# Patient Record
Sex: Female | Born: 1980 | Hispanic: Yes | Marital: Single | Smoking: Never smoker
Health system: Southern US, Community
[De-identification: ages and names within clinical notes are randomized; demographics above are authoritative.]

## PROBLEM LIST (undated history)

## (undated) DIAGNOSIS — M5430 Sciatica, unspecified side: Secondary | ICD-10-CM

---

## 2007-06-24 ENCOUNTER — Inpatient Hospital Stay: Payer: Self-pay | Admitting: Obstetrics and Gynecology

## 2011-09-18 ENCOUNTER — Ambulatory Visit: Payer: Self-pay

## 2011-09-30 ENCOUNTER — Ambulatory Visit: Payer: Self-pay

## 2016-03-15 ENCOUNTER — Encounter: Payer: Self-pay | Admitting: Emergency Medicine

## 2016-03-15 ENCOUNTER — Emergency Department: Payer: BLUE CROSS/BLUE SHIELD

## 2016-03-15 ENCOUNTER — Emergency Department
Admission: EM | Admit: 2016-03-15 | Discharge: 2016-03-15 | Disposition: A | Discharge status: Home / Self Care | Payer: BLUE CROSS/BLUE SHIELD | Attending: Emergency Medicine | Admitting: Emergency Medicine

## 2016-03-15 DIAGNOSIS — O209 Hemorrhage in early pregnancy, unspecified: Secondary | ICD-10-CM | POA: Diagnosis present

## 2016-03-15 DIAGNOSIS — O200 Threatened abortion: Secondary | ICD-10-CM | POA: Diagnosis not present

## 2016-03-15 DIAGNOSIS — Z3A11 11 weeks gestation of pregnancy: Secondary | ICD-10-CM | POA: Insufficient documentation

## 2016-03-15 LAB — ABO/RH: ABO/RH(D): O POS

## 2016-03-15 LAB — URINALYSIS COMPLETE WITH MICROSCOPIC (ARMC ONLY)
BILIRUBIN URINE: NEGATIVE
Bacteria, UA: NONE SEEN
Glucose, UA: NEGATIVE mg/dL
HGB URINE DIPSTICK: NEGATIVE
KETONES UR: NEGATIVE mg/dL
LEUKOCYTES UA: NEGATIVE
Nitrite: NEGATIVE
PH: 7 (ref 5.0–8.0)
Protein, ur: NEGATIVE mg/dL
Specific Gravity, Urine: 1.017 (ref 1.005–1.030)

## 2016-03-15 LAB — CBC
HCT: 38.3 % (ref 35.0–47.0)
HEMOGLOBIN: 12.8 g/dL (ref 12.0–16.0)
MCH: 28.7 pg (ref 26.0–34.0)
MCHC: 33.5 g/dL (ref 32.0–36.0)
MCV: 85.6 fL (ref 80.0–100.0)
PLATELETS: 213 10*3/uL (ref 150–440)
RBC: 4.47 MIL/uL (ref 3.80–5.20)
RDW: 13.7 % (ref 11.5–14.5)
WBC: 9.8 10*3/uL (ref 3.6–11.0)

## 2016-03-15 LAB — HCG, QUANTITATIVE, PREGNANCY: HCG, BETA CHAIN, QUANT, S: 124942 m[IU]/mL — AB (ref <5)

## 2016-03-15 NOTE — ED Provider Notes (Signed)
[facility] Emergency Department Provider Note  Time seen: 4:19 PM  I have reviewed the triage vital signs and the nursing notes.   HISTORY  Chief Complaint Vaginal Bleeding    HPI Nicole Mayo is a 35 y.o. female with no past medical history G3 P2 who is approximately 10-[redacted] weeks pregnant presents the emergency department vaginal spotting. According to the patient she awoke this morning with some mild lower back pain. She states around lunchtime today she urinated and when she wiped she saw a mild amount of blood on the tissue paper. She has checked several times since and continues to have a mild amount of blood on tissue paper. Denies any clots or tissue passage. Denies any abdominal pain or cramping. Denies any history of miscarriage or abortion.     No past medical history on file.  There are no active problems to display for this patient.   No past surgical history on file.  No current outpatient prescriptions on file.  Allergies Review of patient's allergies indicates no known allergies.  No family history on file.  Social History Social History  Substance Use Topics  . Smoking status: Not on file  . Smokeless tobacco: Not on file  . Alcohol Use: Not on file    Review of Systems Constitutional: Negative for fever. Cardiovascular: Negative for chest pain. Respiratory: Negative for shortness of breath. Gastrointestinal: Negative for abdominal pain Genitourinary: Negative for dysuria. Positive for vaginal spotting. Musculoskeletal: Mild lower back pain, dull. Neurological: Negative for headache 10-point ROS otherwise negative.  ____________________________________________   PHYSICAL EXAM:  VITAL SIGNS: ED Triage Vitals  Enc Vitals Group     BP 03/15/16 1443 91/73 mmHg     Pulse Rate 03/15/16 1443 99     Resp 03/15/16 1443 16     Temp 03/15/16 1443 98.4 F (36.9 C)     Temp Source 03/15/16 1443 Oral     SpO2 03/15/16 1443 98  %     Weight 03/15/16 1443 155 lb (70.308 kg)     Height 03/15/16 1443 5\' 5"  (1.651 m)     Head Cir --      Peak Flow --      Pain Score 03/15/16 1444 3     Pain Loc --      Pain Edu? --      Excl. in GC? --     Constitutional: Alert and oriented. Well appearing and in no distress. Eyes: Normal exam ENT   Head: Normocephalic and atraumatic   Mouth/Throat: Mucous membranes are moist. Cardiovascular: Normal rate, regular rhythm. No murmur Respiratory: Normal respiratory effort without tachypnea nor retractions. Breath sounds are clear  Gastrointestinal: Soft and nontender. No distention.   Musculoskeletal: Nontender with normal range of motion in all extremities. Neurologic:  Normal speech and language. No gross focal neurologic deficits Skin:  Skin is warm, dry and intact.  Psychiatric: Mood and affect are normal.   ____________________________________________   RADIOLOGY  Ultrasound shows a single IUP 11 weeks 4 days.  ____________________________________________    INITIAL IMPRESSION / ASSESSMENT AND PLAN / ED COURSE  Pertinent labs & imaging results that were available during my care of the patient were reviewed by me and considered in my medical decision making (see chart for details).  The patient presents the emergency department with vaginal spotting since noon today. She does state some mild lower dull back pain. Denies any abdominal pain denies any cramping. Denies any vaginal discharge. Denies any hematuria  or dysuria. Patient's labs show an elevated beta hCG, urinalysis is negative, CBC is normal. We will proceed with an ultrasound to further evaluate. Patient is agreeable to plan. Overall the patient appears very well, no distress.  Ultrasound is within normal limits showing a single IUP at 11 weeks 4 days. Labs within normal limits. Patient is O+, no RhoGAM required. We'll discharge the patient home with OB/GYN  follow-up.  ____________________________________________   FINAL CLINICAL IMPRESSION(S) / ED DIAGNOSES  Threatened miscarriage   Minna AntisKevin Haakon Titsworth, MD 03/15/16 [phone]

## 2016-03-15 NOTE — ED Notes (Signed)
INterpreter at bedside for d/c teaching and update on test results. Pt verbalized understanding .

## 2016-03-15 NOTE — ED Notes (Signed)
Pt states she is approx 10-12 weeks preg, states that this am she woke up with some light vaginal bleeding and lower back pain

## 2016-03-15 NOTE — Discharge Instructions (Signed)
Amenaza de aborto  (Threatened Miscarriage)  La amenaza de aborto se produce cuando hay hemorragia vaginal durante las primeras 20 semanas de embarazo, pero el embarazo no se interrumpe. El médico le hará pruebas para asegurarse de que el embarazo continúe. La causa de la hemorragia puede ser desconocida. Este trastorno no significa que el embarazo terminará. Sin embargo, aumenta el riesgo de que el embarazo se interrumpa (aborto completo).  CUIDADOS EN EL HOGAR   · Asegúrese de asistir a todas las citas de cuidados prenatales con el médico.  · Descanse lo suficiente.  · No tenga relaciones sexuales ni use tampones si tiene hemorragia vaginal.  · No se haga duchas vaginales.  · No fume ni consuma drogas.  · No beba alcohol.  · Evite la cafeína.  SOLICITE AYUDA SI:  · Tiene una hemorragia leve de la vagina.  · Tiene dolor o cólicos abdominales.  · Tiene fiebre.  SOLICITE AYUDA DE INMEDIATO SI:   · Tiene hemorragia abundante de la vagina.  · Elimina coágulos de sangre por la vagina.  · Tiene mucho dolor en el abdomen o la parte baja de la espalda, cólicos abdominales o calambres en la parte baja de la espalda.  · Tiene fiebre, escalofríos y mucho dolor abdominal.  ASEGÚRESE DE QUE:   · Comprende estas instrucciones.  · Controlará su afección.  · Recibirá ayuda de inmediato si no mejora o si empeora.     Esta información no tiene como fin reemplazar el consejo del médico. Asegúrese de hacerle al médico cualquier pregunta que tenga.     Document Released: 10/05/2010 Document Revised: 09/07/2013  Elsevier Interactive Patient Education ©2016 [employer]vier Inc[employer]

## 2016-03-22 LAB — OB RESULTS CONSOLE HIV ANTIBODY (ROUTINE TESTING): HIV: NONREACTIVE

## 2016-03-22 LAB — OB RESULTS CONSOLE VARICELLA ZOSTER ANTIBODY, IGG: VARICELLA IGG: IMMUNE

## 2016-03-22 LAB — OB RESULTS CONSOLE GC/CHLAMYDIA
CHLAMYDIA, DNA PROBE: NEGATIVE
Gonorrhea: NEGATIVE

## 2016-03-22 LAB — OB RESULTS CONSOLE HEPATITIS B SURFACE ANTIGEN: Hepatitis B Surface Ag: NEGATIVE

## 2016-03-22 LAB — OB RESULTS CONSOLE RUBELLA ANTIBODY, IGM
RUBELLA: IMMUNE
Rubella: NON-IMMUNE/NOT IMMUNE

## 2016-03-22 LAB — OB RESULTS CONSOLE RPR: RPR: NONREACTIVE

## 2016-04-19 ENCOUNTER — Other Ambulatory Visit: Payer: Self-pay | Admitting: Primary Care

## 2016-04-19 DIAGNOSIS — Z3490 Encounter for supervision of normal pregnancy, unspecified, unspecified trimester: Secondary | ICD-10-CM

## 2016-04-26 ENCOUNTER — Ambulatory Visit
Admission: RE | Admit: 2016-04-26 | Discharge: 2016-04-26 | Disposition: A | Discharge status: Home / Self Care | Payer: BLUE CROSS/BLUE SHIELD | Source: Ambulatory Visit | Attending: Primary Care | Admitting: Primary Care

## 2016-04-26 DIAGNOSIS — Z331 Pregnant state, incidental: Secondary | ICD-10-CM | POA: Insufficient documentation

## 2016-04-26 DIAGNOSIS — Z3A17 17 weeks gestation of pregnancy: Secondary | ICD-10-CM | POA: Insufficient documentation

## 2016-04-26 DIAGNOSIS — O321XX0 Maternal care for breech presentation, not applicable or unspecified: Secondary | ICD-10-CM | POA: Diagnosis not present

## 2016-04-26 DIAGNOSIS — Z3490 Encounter for supervision of normal pregnancy, unspecified, unspecified trimester: Secondary | ICD-10-CM

## 2016-05-07 ENCOUNTER — Other Ambulatory Visit: Payer: Self-pay | Admitting: Primary Care

## 2016-05-07 DIAGNOSIS — Z0489 Encounter for examination and observation for other specified reasons: Secondary | ICD-10-CM

## 2016-05-07 DIAGNOSIS — IMO0002 Reserved for concepts with insufficient information to code with codable children: Secondary | ICD-10-CM

## 2016-05-10 ENCOUNTER — Other Ambulatory Visit: Payer: Self-pay | Admitting: Primary Care

## 2016-05-10 DIAGNOSIS — Z0489 Encounter for examination and observation for other specified reasons: Secondary | ICD-10-CM

## 2016-05-10 DIAGNOSIS — IMO0002 Reserved for concepts with insufficient information to code with codable children: Secondary | ICD-10-CM

## 2016-05-16 ENCOUNTER — Ambulatory Visit: Admission: RE | Admit: 2016-05-16 | Payer: BLUE CROSS/BLUE SHIELD | Source: Ambulatory Visit

## 2016-05-16 ENCOUNTER — Ambulatory Visit
Admission: RE | Admit: 2016-05-16 | Discharge: 2016-05-16 | Disposition: A | Discharge status: Home / Self Care | Payer: BLUE CROSS/BLUE SHIELD | Source: Ambulatory Visit | Attending: Primary Care | Admitting: Primary Care

## 2016-05-16 DIAGNOSIS — Z331 Pregnant state, incidental: Secondary | ICD-10-CM | POA: Insufficient documentation

## 2016-05-16 DIAGNOSIS — IMO0002 Reserved for concepts with insufficient information to code with codable children: Secondary | ICD-10-CM

## 2016-05-16 DIAGNOSIS — Z3A20 20 weeks gestation of pregnancy: Secondary | ICD-10-CM | POA: Diagnosis not present

## 2016-05-16 DIAGNOSIS — Z0489 Encounter for examination and observation for other specified reasons: Secondary | ICD-10-CM

## 2016-08-30 LAB — OB RESULTS CONSOLE GBS: GBS: POSITIVE

## 2016-09-24 ENCOUNTER — Observation Stay
Admission: EM | Admit: 2016-09-24 | Discharge: 2016-09-24 | Disposition: A | Discharge status: Home / Self Care | Payer: BLUE CROSS/BLUE SHIELD | Attending: Obstetrics and Gynecology | Admitting: Obstetrics and Gynecology

## 2016-09-24 DIAGNOSIS — O368190 Decreased fetal movements, unspecified trimester, not applicable or unspecified: Secondary | ICD-10-CM | POA: Diagnosis not present

## 2016-09-24 DIAGNOSIS — Z3A00 Weeks of gestation of pregnancy not specified: Secondary | ICD-10-CM | POA: Diagnosis not present

## 2016-09-24 HISTORY — DX: Sciatica, unspecified side: M54.30

## 2016-09-24 MED ORDER — ACETAMINOPHEN 325 MG PO TABS: 650.0000 mg | ORAL_TABLET | ORAL | Status: DC | PRN

## 2016-09-24 MED ORDER — OXYCODONE-ACETAMINOPHEN 5-325 MG PO TABS: 1.0000 | ORAL_TABLET | ORAL | Status: DC | PRN

## 2016-09-24 MED ORDER — LACTATED RINGERS IV SOLN: 500.0000 mL | INTRAVENOUS | Status: DC | PRN

## 2016-09-24 NOTE — OB Triage Note (Signed)
Pt presents c/o No fetal movement since last night around 7:00 pm. Denies any pain, bleeding, LOF, or NVD.

## 2016-09-24 NOTE — H&P (Signed)
Nicole Mayo is a 36 y.o. female presenting for "decreased fetal movement today". [facility]  significant for LMP of  12/19/15 & EDD of 09/24/16 and c/w EDD of 09/30/16 per US at 11 4/7 weeks.  OB History    Gravida Para Term Preterm AB Living   3 2       2    SAB TAB Ectopic Multiple Live Births                 Past Medical History:  Diagnosis Date  . Sciatica   ASCUS pap hx History reviewed. No pertinent surgical history. Family History:Dad: HTN, Aunt: DM Social History:  reports that she has never smoked. She has never used smokeless tobacco. She reports that she does not drink alcohol or use drugs.     Maternal Diabetes: 102 Genetic Screening: no 1st trimester screen, ?Declined AFP Maternal Ultrasounds/Referrals: US: WNL Fetal Ultrasounds or other Referrals:  None Maternal Substance Abuse:  None Significant Maternal Medications:  PNV Significant Maternal Lab Results:  Per below Other Comments:   Review of Systems  Constitutional: Negative.   HENT: Negative.   Eyes: Negative.   Respiratory: Negative.   Cardiovascular: Negative.   Gastrointestinal: Negative.   Genitourinary: Negative.   Musculoskeletal: Negative.   Skin: Negative.   Neurological: Negative.   Endo/Heme/Allergies: Negative.   Psychiatric/Behavioral: Negative.    History   Blood pressure 112/87, pulse (!) 120, temperature 98.3 F (36.8 C), temperature source Oral, resp. rate 20, height 5\' 5"  (1.651 m), weight 86.6 kg (191 lb), last menstrual period 12/23/2015. Exam Physical Exam  Gen: 36 yo Hispanic female in NAD. HEENT: Eyes non-icteric. Normocephalic. Resp: reg and non-labored. ABD: gravid Vaginal: VE done with CX:1/50%/very post cx vtx-3 Vitals:   09/24/16 1743  BP: 112/87  Pulse: (!) 120  Resp: 20  Temp: 98.3 F (36.8 C)   Prenatal labs: ABO, Rh: --/--/O POS (06/30 1449) Antibody:  Neg Rubella:  Non-immune RPR:   RPR NR, GC/CH neg,  HBsAg:   Neg HIV:   NR GBS:    1 h GCt  102 Assessment/Plan: A: IUP at  2. Decreased FM P: NST Reactive with 2 accels 15 x 15 BPM 2. No Labor S/S 3. Dc home with daily FKC's. 4. [facility]  Nicole Mayo 09/24/2016, 6:13 PM

## 2016-09-24 NOTE — Discharge Instructions (Signed)
Evaluación de los movimientos fetales   (Fetal Movement Counts)  Nombre del paciente: __________________________________________________ Fecha de parto estimada: ____________________  La evaluación de los movimientos fetales es muy recomendable en los embarazos de alto riesgo, pero también es una buena idea que lo hagan todas las embarazadas. El médico le indicará que comience a contarlos a las 28 semanas de embarazo. Los movimientos fetales suelen aumentar:   · Después de una comida completa.  · Después de la actividad física.  · Después de comer o beber algo dulce o frío.  · En reposo.  Preste atención cuando sienta que el bebé está más activo. Esto le ayudará a notar un patrón de ciclos de vigilia y sueño de su bebé y cuáles son los factores que contribuyen a un aumento de los movimientos fetales. Es importante llevar a cabo un recuento de movimientos fetales, al mismo tiempo cada día, cuando el bebé normalmente está más activo.   CÓMO CONTAR LOS MOVIMIENTOS FETALES  1. Busque un lugar tranquilo y cómodo para sentarse o recostarse sobre el lado izquierdo. Al recostarse sobre su lado izquierdo, le proporciona una mejor circulación de sangre y oxígeno al bebé.  2. Anote el día y la hora en una hoja de papel o en un diario.  3. Comience contando las pataditas, revoloteos, chasquidos, vueltas o pinchazos en un período de 2 horas. Debe sentir al menos 10 movimientos en 2 horas.  4. Si no siente 10 movimientos en 2 horas, espere 2 ó 3 horas y cuente de nuevo. Busque cambios en el patrón o si no cuenta lo suficiente en 2 horas.  SOLICITE ATENCIÓN MÉDICA SI:   · Siente menos de 10 pataditas en 2 horas, en dos intentos.  · No hay movimientos durante una hora.  · El patrón se modifica o le lleva más tiempo cada día contar las 10 pataditas.  · Siente que el bebé no se mueve como lo hace habitualmente.  Fecha: ____________ Movimientos: ____________ Hora de inicio: ____________ Hora de finalización: ____________   Fecha:  ____________ Movimientos: ____________ Hora de inicio: ____________ Hora de finalización: ____________   Fecha: ____________ Movimientos: ____________ Hora de inicio: ____________ Hora de finalización: ____________   Fecha: ____________ Movimientos: ____________ Hora de inicio: ____________ Hora de finalización: ____________   Fecha: ____________ Movimientos: ____________ Hora de inicio: ____________ Hora de finalización: ____________   Fecha: ____________ Movimientos: ____________ Hora de inicio: ____________ Hora de finalización: ____________   Fecha: ____________ Movimientos: ____________ Hora de inicio: ____________ Hora de finalización: ____________   Fecha: ____________ Movimientos: ____________ Hora de inicio: ____________ Hora de finalización: ____________   Fecha: ____________ Movimientos: ____________ Hora de inicio: ____________ Hora de finalización: ____________   Fecha: ____________ Movimientos: ____________ Hora de inicio: ____________ Hora de finalización: ____________   Fecha: ____________ Movimientos: ____________ Hora de inicio: ____________ Hora de finalización: ____________   Fecha: ____________ Movimientos: ____________ Hora de inicio: ____________ Hora de finalización: ____________   Fecha: ____________ Movimientos: ____________ Hora de inicio: ____________ Hora de finalización: ____________   Fecha: ____________ Movimientos: ____________ Hora de inicio: ____________ Hora de finalización: ____________   Fecha: ____________ Movimientos: ____________ Hora de inicio: ____________ Hora de finalización: ____________   Fecha: ____________ Movimientos: ____________ Hora de inicio: ____________ Hora de finalización: ____________   Fecha: ____________ Movimientos: ____________ Hora de inicio: ____________ Hora de finalización: ____________   Fecha: ____________ Movimientos: ____________ Hora de inicio: ____________ Hora de finalización: ____________   Fecha: ____________ Movimientos: ____________ Hora  de inicio: ____________ Hora de finalización: ____________     Fecha: ____________ Movimientos: ____________ Hora de inicio: ____________ Hora de finalización: ____________   Fecha: ____________ Movimientos: ____________ Hora de inicio: ____________ Hora de finalización: ____________   Fecha: ____________ Movimientos: ____________ Hora de inicio: ____________ Hora de finalización: ____________   Fecha: ____________ Movimientos: ____________ Hora de inicio: ____________ Hora de finalización: ____________   Fecha: ____________ Movimientos: ____________ Hora de inicio: ____________ Hora de finalización: ____________   Fecha: ____________ Movimientos: ____________ Hora de inicio: ____________ Hora de finalización: ____________   Fecha: ____________ Movimientos: ____________ Hora de inicio: ____________ Hora de finalización: ____________   Fecha: ____________ Movimientos: ____________ Hora de inicio: ____________ Hora de finalización: ____________   Fecha: ____________ Movimientos: ____________ Hora de inicio: ____________ Hora de finalización: ____________   Fecha: ____________ Movimientos: ____________ Hora de inicio: ____________ Hora de finalización: ____________   Fecha: ____________ Movimientos: ____________ Hora de inicio: ____________ Hora de finalización: ____________   Fecha: ____________ Movimientos: ____________ Hora de inicio: ____________ Hora de finalización: ____________   Fecha: ____________ Movimientos: ____________ Hora de inicio: ____________ Hora de finalización: ____________   Fecha: ____________ Movimientos: ____________ Hora de inicio: ____________ Hora de finalización: ____________   Fecha: ____________ Movimientos: ____________ Hora de inicio: ____________ Hora de finalización: ____________   Fecha: ____________ Movimientos: ____________ Hora de inicio: ____________ Hora de finalización: ____________   Fecha: ____________ Movimientos: ____________ Hora de inicio: ____________ Hora de finalización:  ____________   Fecha: ____________ Movimientos: ____________ Hora de inicio: ____________ Hora de finalización: ____________   Fecha: ____________ Movimientos: ____________ Hora de inicio: ____________ Hora de finalización: ____________   Fecha: ____________ Movimientos: ____________ Hora de inicio: ____________ Hora de finalización: ____________   Fecha: ____________ Movimientos: ____________ Hora de inicio: ____________ Hora de finalización: ____________   Fecha: ____________ Movimientos: ____________ Hora de inicio: ____________ Hora de finalización: ____________   Fecha: ____________ Movimientos: ____________ Hora de inicio: ____________ Hora de finalización: ____________   Fecha: ____________ Movimientos: ____________ Hora de inicio: ____________ Hora de finalización: ____________   Fecha: ____________ Movimientos: ____________ Hora de inicio: ____________ Hora de finalización: ____________   Fecha: ____________ Movimientos: ____________ Hora de inicio: ____________ Hora de finalización: ____________   Fecha: ____________ Movimientos: ____________ Hora de inicio: ____________ Hora de finalización: ____________   Fecha: ____________ Movimientos: ____________ Hora de inicio: ____________ Hora de finalización: ____________   Fecha: ____________ Movimientos: ____________ Hora de inicio: ____________ Hora de finalización: ____________   Fecha: ____________ Movimientos: ____________ Hora de inicio: ____________ Hora de finalización: ____________   Fecha: ____________ Movimientos: ____________ Hora de inicio: ____________ Hora de finalización: ____________   Fecha: ____________ Movimientos: ____________ Hora de inicio: ____________ Hora de finalización: ____________   Fecha: ____________ Movimientos: ____________ Hora de inicio: ____________ Hora de finalización: ____________   Fecha: ____________ Movimientos: ____________ Hora de inicio: ____________ Hora de finalización: ____________   Fecha: ____________  Movimientos: ____________ Hora de inicio: ____________ Hora de finalización: ____________   Fecha: ____________ Movimientos: ____________ Hora de inicio: ____________ Hora de finalización: ____________   Fecha: ____________ Movimientos: ____________ Hora de inicio: ____________ Hora de finalización: ____________   Esta información no tiene como fin reemplazar el consejo del médico. Asegúrese de hacerle al médico cualquier pregunta que tenga.  Document Released: 12/10/2007 Document Revised: 08/19/2012  Elsevier Interactive Patient Education © 2017 Elsevier Inc.

## 2016-10-06 ENCOUNTER — Inpatient Hospital Stay
Admission: RE | Admit: 2016-10-06 | Discharge: 2016-10-09 | DRG: 765 | Disposition: A | Discharge status: Home / Self Care | Payer: BLUE CROSS/BLUE SHIELD | Attending: Obstetrics and Gynecology | Admitting: Obstetrics and Gynecology

## 2016-10-06 DIAGNOSIS — O9081 Anemia of the puerperium: Secondary | ICD-10-CM | POA: Diagnosis not present

## 2016-10-06 DIAGNOSIS — O329XX0 Maternal care for malpresentation of fetus, unspecified, not applicable or unspecified: Secondary | ICD-10-CM

## 2016-10-06 DIAGNOSIS — E669 Obesity, unspecified: Secondary | ICD-10-CM | POA: Diagnosis present

## 2016-10-06 DIAGNOSIS — B951 Streptococcus, group B, as the cause of diseases classified elsewhere: Secondary | ICD-10-CM | POA: Diagnosis present

## 2016-10-06 DIAGNOSIS — O480 Post-term pregnancy: Principal | ICD-10-CM | POA: Diagnosis present

## 2016-10-06 DIAGNOSIS — D696 Thrombocytopenia, unspecified: Secondary | ICD-10-CM | POA: Diagnosis not present

## 2016-10-06 DIAGNOSIS — D62 Acute posthemorrhagic anemia: Secondary | ICD-10-CM | POA: Diagnosis not present

## 2016-10-06 DIAGNOSIS — O4103X0 Oligohydramnios, third trimester, not applicable or unspecified: Secondary | ICD-10-CM | POA: Diagnosis present

## 2016-10-06 DIAGNOSIS — O99824 Streptococcus B carrier state complicating childbirth: Secondary | ICD-10-CM | POA: Diagnosis present

## 2016-10-06 DIAGNOSIS — Z6831 Body mass index (BMI) 31.0-31.9, adult: Secondary | ICD-10-CM

## 2016-10-06 DIAGNOSIS — O321XX0 Maternal care for breech presentation, not applicable or unspecified: Secondary | ICD-10-CM | POA: Diagnosis present

## 2016-10-06 DIAGNOSIS — O99214 Obesity complicating childbirth: Secondary | ICD-10-CM | POA: Diagnosis present

## 2016-10-06 DIAGNOSIS — Z3A40 40 weeks gestation of pregnancy: Secondary | ICD-10-CM | POA: Diagnosis not present

## 2016-10-06 MED ORDER — ONDANSETRON HCL 4 MG/2ML IJ SOLN: 4.0000 mg | Freq: Four times a day (QID) | INTRAMUSCULAR | Status: DC | PRN

## 2016-10-06 MED ORDER — LACTATED RINGERS IV SOLN
INTRAVENOUS | Status: DC
  Administered 2016-10-07 (×2): via INTRAVENOUS

## 2016-10-06 MED ORDER — DINOPROSTONE 10 MG VA INST
10.0000 mg | VAGINAL_INSERT | Freq: Once | VAGINAL | Status: DC
  Filled 2016-10-06: qty 1

## 2016-10-06 MED ORDER — OXYTOCIN BOLUS FROM INFUSION: 500.0000 mL | Freq: Once | INTRAVENOUS | Status: DC

## 2016-10-06 MED ORDER — LIDOCAINE HCL (PF) 1 % IJ SOLN: 30.0000 mL | INTRAMUSCULAR | Status: DC | PRN

## 2016-10-06 MED ORDER — TERBUTALINE SULFATE 1 MG/ML IJ SOLN: 0.2500 mg | Freq: Once | INTRAMUSCULAR | Status: DC | PRN

## 2016-10-06 MED ORDER — SOD CITRATE-CITRIC ACID 500-334 MG/5ML PO SOLN: 30.0000 mL | ORAL | Status: DC | PRN

## 2016-10-06 MED ORDER — PENICILLIN G POTASSIUM 5000000 UNITS IJ SOLR
5.0000 10*6.[IU] | Freq: Once | INTRAVENOUS | Status: AC
  Administered 2016-10-07: 5 10*6.[IU] via INTRAVENOUS
  Filled 2016-10-06: qty 5

## 2016-10-06 MED ORDER — ACETAMINOPHEN 325 MG PO TABS: 650.0000 mg | ORAL_TABLET | ORAL | Status: DC | PRN

## 2016-10-06 MED ORDER — OXYTOCIN 40 UNITS IN LACTATED RINGERS INFUSION - SIMPLE MED: 2.5000 [IU]/h | INTRAVENOUS | Status: DC

## 2016-10-06 MED ORDER — LACTATED RINGERS IV SOLN
500.0000 mL | INTRAVENOUS | Status: DC | PRN
  Administered 2016-10-07: 1000 mL via INTRAVENOUS

## 2016-10-06 MED ORDER — PENICILLIN G POT IN DEXTROSE 60000 UNIT/ML IV SOLN
3.0000 10*6.[IU] | INTRAVENOUS | Status: DC
  Administered 2016-10-07 (×2): 3 10*6.[IU] via INTRAVENOUS
  Filled 2016-10-06 (×7): qty 50

## 2016-10-06 MED ORDER — BUTORPHANOL TARTRATE 1 MG/ML IJ SOLN: 1.0000 mg | INTRAMUSCULAR | Status: DC | PRN

## 2016-10-07 ENCOUNTER — Inpatient Hospital Stay: Payer: BLUE CROSS/BLUE SHIELD

## 2016-10-07 ENCOUNTER — Encounter: Payer: Self-pay | Admitting: Obstetrics and Gynecology

## 2016-10-07 ENCOUNTER — Inpatient Hospital Stay: Payer: BLUE CROSS/BLUE SHIELD | Admitting: Anesthesiology

## 2016-10-07 ENCOUNTER — Encounter
Admission: RE | Disposition: A | Discharge status: Home / Self Care | Payer: Self-pay | Source: Home / Self Care | Attending: Obstetrics and Gynecology

## 2016-10-07 LAB — CBC
HCT: 38.7 % (ref 35.0–47.0)
HEMOGLOBIN: 13.2 g/dL (ref 12.0–16.0)
MCH: 28.6 pg (ref 26.0–34.0)
MCHC: 34.1 g/dL (ref 32.0–36.0)
MCV: 84 fL (ref 80.0–100.0)
Platelets: 161 10*3/uL (ref 150–440)
RBC: 4.6 MIL/uL (ref 3.80–5.20)
RDW: 15 % — ABNORMAL HIGH (ref 11.5–14.5)
WBC: 10.4 10*3/uL (ref 3.6–11.0)

## 2016-10-07 LAB — COMPREHENSIVE METABOLIC PANEL
ALBUMIN: 3 g/dL — AB (ref 3.5–5.0)
ALT: 15 U/L (ref 14–54)
AST: 26 U/L (ref 15–41)
Alkaline Phosphatase: 176 U/L — ABNORMAL HIGH (ref 38–126)
Anion gap: 7 (ref 5–15)
BUN: 17 mg/dL (ref 6–20)
CHLORIDE: 107 mmol/L (ref 101–111)
CO2: 23 mmol/L (ref 22–32)
Calcium: 8.9 mg/dL (ref 8.9–10.3)
Creatinine, Ser: 0.71 mg/dL (ref 0.44–1.00)
GFR calc Af Amer: >60 mL/min (ref >60)
GFR calc non Af Amer: >60 mL/min (ref >60)
GLUCOSE: 58 mg/dL — AB (ref 65–99)
POTASSIUM: 4 mmol/L (ref 3.5–5.1)
SODIUM: 137 mmol/L (ref 135–145)
Total Bilirubin: 0.2 mg/dL — ABNORMAL LOW (ref 0.3–1.2)
Total Protein: 6.7 g/dL (ref 6.5–8.1)

## 2016-10-07 LAB — PROTEIN / CREATININE RATIO, URINE
Creatinine, Urine: 41 mg/dL
Protein Creatinine Ratio: 0.22 mg/mg{Cre} — ABNORMAL HIGH (ref 0.00–0.15)
Total Protein, Urine: 9 mg/dL

## 2016-10-07 LAB — TYPE AND SCREEN
ABO/RH(D): O POS
Antibody Screen: NEGATIVE

## 2016-10-07 SURGERY — Surgical Case
Anesthesia: Spinal | Wound class: Clean Contaminated

## 2016-10-07 MED ORDER — NALOXONE HCL 2 MG/2ML IJ SOSY
1.0000 ug/kg/h | PREFILLED_SYRINGE | INTRAVENOUS | Status: DC | PRN
  Filled 2016-10-07: qty 2

## 2016-10-07 MED ORDER — PROPOFOL 10 MG/ML IV BOLUS
INTRAVENOUS | Status: AC
  Filled 2016-10-07: qty 20

## 2016-10-07 MED ORDER — NALBUPHINE HCL 10 MG/ML IJ SOLN: 5.0000 mg | INTRAMUSCULAR | Status: DC | PRN

## 2016-10-07 MED ORDER — BUPIVACAINE HCL (PF) 0.25 % IJ SOLN
INTRAMUSCULAR | Status: AC
  Filled 2016-10-07: qty 30

## 2016-10-07 MED ORDER — KETOROLAC TROMETHAMINE 30 MG/ML IJ SOLN
30.0000 mg | Freq: Four times a day (QID) | INTRAMUSCULAR | Status: AC
  Administered 2016-10-08 (×2): 30 mg via INTRAVENOUS
  Filled 2016-10-07 (×2): qty 1

## 2016-10-07 MED ORDER — OXYCODONE HCL 5 MG PO TABS
10.0000 mg | ORAL_TABLET | ORAL | Status: DC | PRN
  Filled 2016-10-07: qty 2

## 2016-10-07 MED ORDER — NALOXONE HCL 0.4 MG/ML IJ SOLN: 0.4000 mg | INTRAMUSCULAR | Status: DC | PRN

## 2016-10-07 MED ORDER — DIPHENHYDRAMINE HCL 25 MG PO CAPS
25.0000 mg | ORAL_CAPSULE | Freq: Four times a day (QID) | ORAL | Status: DC | PRN
  Administered 2016-10-07: 25 mg via ORAL
  Filled 2016-10-07: qty 1

## 2016-10-07 MED ORDER — CEFAZOLIN SODIUM-DEXTROSE 2-4 GM/100ML-% IV SOLN
2.0000 g | INTRAVENOUS | Status: AC
  Administered 2016-10-07: 2 g via INTRAVENOUS
  Filled 2016-10-07 (×2): qty 100

## 2016-10-07 MED ORDER — MORPHINE SULFATE (PF) 0.5 MG/ML IJ SOLN
INTRAMUSCULAR | Status: DC | PRN
  Administered 2016-10-07: .1 mg via INTRATHECAL

## 2016-10-07 MED ORDER — KETOROLAC TROMETHAMINE 30 MG/ML IJ SOLN: 30.0000 mg | Freq: Four times a day (QID) | INTRAMUSCULAR | Status: AC

## 2016-10-07 MED ORDER — BUPIVACAINE LIPOSOME 1.3 % IJ SUSP
INTRAMUSCULAR | Status: DC | PRN
  Administered 2016-10-07: 70 mL

## 2016-10-07 MED ORDER — OXYTOCIN 40 UNITS IN LACTATED RINGERS INFUSION - SIMPLE MED
2.5000 [IU]/h | INTRAVENOUS | Status: AC
  Filled 2016-10-07: qty 1000

## 2016-10-07 MED ORDER — SODIUM CHLORIDE 0.9 % IJ SOLN
INTRAMUSCULAR | Status: AC
  Filled 2016-10-07: qty 50

## 2016-10-07 MED ORDER — BUPIVACAINE HCL (PF) 0.25 % IJ SOLN
INTRAMUSCULAR | Status: DC | PRN
  Administered 2016-10-07: 30 mL

## 2016-10-07 MED ORDER — OXYCODONE-ACETAMINOPHEN 5-325 MG PO TABS
2.0000 | ORAL_TABLET | ORAL | Status: DC | PRN
  Filled 2016-10-07: qty 2

## 2016-10-07 MED ORDER — DIPHENHYDRAMINE HCL 25 MG PO CAPS
25.0000 mg | ORAL_CAPSULE | ORAL | Status: DC | PRN
  Administered 2016-10-08: 25 mg via ORAL
  Filled 2016-10-07: qty 1

## 2016-10-07 MED ORDER — OXYCODONE HCL 5 MG PO TABS
10.0000 mg | ORAL_TABLET | ORAL | Status: DC | PRN
Start: 2016-10-07 — End: 2016-10-07

## 2016-10-07 MED ORDER — SENNOSIDES-DOCUSATE SODIUM 8.6-50 MG PO TABS
2.0000 | ORAL_TABLET | ORAL | Status: DC
  Administered 2016-10-08 – 2016-10-09 (×2): 2 via ORAL
  Filled 2016-10-07 (×2): qty 2

## 2016-10-07 MED ORDER — NALBUPHINE HCL 10 MG/ML IJ SOLN: 5.0000 mg | Freq: Once | INTRAMUSCULAR | Status: DC | PRN

## 2016-10-07 MED ORDER — ONDANSETRON HCL 4 MG/2ML IJ SOLN: 4.0000 mg | Freq: Three times a day (TID) | INTRAMUSCULAR | Status: DC | PRN

## 2016-10-07 MED ORDER — FLEET ENEMA 7-19 GM/118ML RE ENEM: 1.0000 | ENEMA | Freq: Every day | RECTAL | Status: DC | PRN

## 2016-10-07 MED ORDER — OXYTOCIN 40 UNITS IN LACTATED RINGERS INFUSION - SIMPLE MED
INTRAVENOUS | Status: DC | PRN
  Administered 2016-10-07: 500 mL via INTRAVENOUS

## 2016-10-07 MED ORDER — SODIUM CHLORIDE 0.9% FLUSH: 3.0000 mL | INTRAVENOUS | Status: DC | PRN

## 2016-10-07 MED ORDER — DIPHENHYDRAMINE HCL 50 MG/ML IJ SOLN: 12.5000 mg | INTRAMUSCULAR | Status: DC | PRN

## 2016-10-07 MED ORDER — OXYCODONE HCL 5 MG PO TABS: 5.0000 mg | ORAL_TABLET | ORAL | Status: DC | PRN

## 2016-10-07 MED ORDER — BISACODYL 10 MG RE SUPP: 10.0000 mg | Freq: Every day | RECTAL | Status: DC | PRN

## 2016-10-07 MED ORDER — SIMETHICONE 80 MG PO CHEW
80.0000 mg | CHEWABLE_TABLET | ORAL | Status: DC
  Filled 2016-10-07: qty 1

## 2016-10-07 MED ORDER — HYDRALAZINE HCL 20 MG/ML IJ SOLN
10.0000 mg | Freq: Once | INTRAMUSCULAR | Status: DC | PRN
  Filled 2016-10-07: qty 1

## 2016-10-07 MED ORDER — SIMETHICONE 80 MG PO CHEW
80.0000 mg | CHEWABLE_TABLET | Freq: Three times a day (TID) | ORAL | Status: DC
  Administered 2016-10-08 – 2016-10-09 (×4): 80 mg via ORAL
  Filled 2016-10-07 (×4): qty 1

## 2016-10-07 MED ORDER — DIBUCAINE 1 % RE OINT: 1.0000 "application " | TOPICAL_OINTMENT | RECTAL | Status: DC | PRN

## 2016-10-07 MED ORDER — IBUPROFEN 600 MG PO TABS
600.0000 mg | ORAL_TABLET | Freq: Four times a day (QID) | ORAL | Status: DC
  Administered 2016-10-07 – 2016-10-09 (×5): 600 mg via ORAL
  Filled 2016-10-07 (×5): qty 1

## 2016-10-07 MED ORDER — PHENYLEPHRINE HCL 10 MG/ML IJ SOLN
INTRAMUSCULAR | Status: DC | PRN
Start: 2016-10-07 — End: 2016-10-07
  Administered 2016-10-07 (×5): 100 ug via INTRAVENOUS
  Administered 2016-10-07: 80 ug via INTRAVENOUS

## 2016-10-07 MED ORDER — SIMETHICONE 80 MG PO CHEW
80.0000 mg | CHEWABLE_TABLET | ORAL | Status: DC | PRN
  Administered 2016-10-08: 80 mg via ORAL

## 2016-10-07 MED ORDER — MENTHOL 3 MG MT LOZG
1.0000 | LOZENGE | OROMUCOSAL | Status: DC | PRN
  Filled 2016-10-07: qty 9

## 2016-10-07 MED ORDER — BUPIVACAINE IN DEXTROSE 0.75-8.25 % IT SOLN
INTRATHECAL | Status: DC | PRN
  Administered 2016-10-07: 1.6 mL via INTRATHECAL

## 2016-10-07 MED ORDER — PRENATAL MULTIVITAMIN CH
1.0000 | ORAL_TABLET | Freq: Every day | ORAL | Status: DC
  Administered 2016-10-08: 1 via ORAL
  Filled 2016-10-07: qty 1

## 2016-10-07 MED ORDER — LACTATED RINGERS IV SOLN
INTRAVENOUS | Status: DC
  Administered 2016-10-07: 12:00:00 via INTRAVENOUS

## 2016-10-07 MED ORDER — ACETAMINOPHEN 325 MG PO TABS
650.0000 mg | ORAL_TABLET | ORAL | Status: DC | PRN
  Administered 2016-10-08: 650 mg via ORAL

## 2016-10-07 MED ORDER — MORPHINE SULFATE (PF) 0.5 MG/ML IJ SOLN
INTRAMUSCULAR | Status: AC
  Filled 2016-10-07: qty 10

## 2016-10-07 MED ORDER — OXYTOCIN 40 UNITS IN LACTATED RINGERS INFUSION - SIMPLE MED
INTRAVENOUS | Status: AC
  Administered 2016-10-07: 15:00:00
  Filled 2016-10-07: qty 1000

## 2016-10-07 MED ORDER — COCONUT OIL OIL: 1.0000 "application " | TOPICAL_OIL | Status: DC | PRN

## 2016-10-07 MED ORDER — EPHEDRINE SULFATE 50 MG/ML IJ SOLN
INTRAMUSCULAR | Status: DC | PRN
  Administered 2016-10-07 (×3): 10 mg via INTRAVENOUS

## 2016-10-07 MED ORDER — LABETALOL HCL 5 MG/ML IV SOLN
20.0000 mg | INTRAVENOUS | Status: DC | PRN
  Filled 2016-10-07: qty 16

## 2016-10-07 MED ORDER — SCOPOLAMINE 1 MG/3DAYS TD PT72: 1.0000 | MEDICATED_PATCH | Freq: Once | TRANSDERMAL | Status: DC

## 2016-10-07 MED ORDER — ACETAMINOPHEN 325 MG PO TABS
650.0000 mg | ORAL_TABLET | Freq: Four times a day (QID) | ORAL | Status: AC
  Administered 2016-10-08 (×3): 650 mg via ORAL
  Filled 2016-10-07 (×4): qty 2

## 2016-10-07 MED ORDER — LACTATED RINGERS IV SOLN
INTRAVENOUS | Status: DC
  Administered 2016-10-08: 07:00:00 via INTRAVENOUS

## 2016-10-07 MED ORDER — BUPIVACAINE LIPOSOME 1.3 % IJ SUSP
INTRAMUSCULAR | Status: AC
  Filled 2016-10-07: qty 20

## 2016-10-07 MED ORDER — OXYTOCIN 40 UNITS IN LACTATED RINGERS INFUSION - SIMPLE MED
INTRAVENOUS | Status: AC
  Filled 2016-10-07: qty 1000

## 2016-10-07 MED ORDER — FENTANYL CITRATE (PF) 100 MCG/2ML IJ SOLN
INTRAMUSCULAR | Status: DC | PRN
  Administered 2016-10-07: 15 ug via INTRATHECAL

## 2016-10-07 MED ORDER — FENTANYL CITRATE (PF) 100 MCG/2ML IJ SOLN
INTRAMUSCULAR | Status: AC
  Filled 2016-10-07: qty 2

## 2016-10-07 MED ORDER — MEASLES, MUMPS & RUBELLA VAC ~~LOC~~ INJ
0.5000 mL | INJECTION | Freq: Once | SUBCUTANEOUS | Status: DC
  Filled 2016-10-07: qty 0.5

## 2016-10-07 MED ORDER — OXYCODONE HCL 5 MG PO TABS
5.0000 mg | ORAL_TABLET | ORAL | Status: DC | PRN
  Administered 2016-10-08: 5 mg via ORAL
  Filled 2016-10-07: qty 1

## 2016-10-07 MED ORDER — OXYCODONE-ACETAMINOPHEN 5-325 MG PO TABS
1.0000 | ORAL_TABLET | ORAL | Status: DC | PRN
Start: 2016-10-07 — End: 2016-10-09
  Administered 2016-10-08: 1 via ORAL

## 2016-10-07 MED ORDER — WITCH HAZEL-GLYCERIN EX PADS: 1.0000 "application " | MEDICATED_PAD | CUTANEOUS | Status: DC | PRN

## 2016-10-07 MED ORDER — TETANUS-DIPHTH-ACELL PERTUSSIS 5-2.5-18.5 LF-MCG/0.5 IM SUSP: 0.5000 mL | Freq: Once | INTRAMUSCULAR | Status: DC

## 2016-10-07 MED ORDER — ONDANSETRON HCL 4 MG/2ML IJ SOLN
INTRAMUSCULAR | Status: AC
  Filled 2016-10-07: qty 4

## 2016-10-07 MED ORDER — SOD CITRATE-CITRIC ACID 500-334 MG/5ML PO SOLN
30.0000 mL | ORAL | Status: AC
  Administered 2016-10-07: 30 mL via ORAL
  Filled 2016-10-07: qty 30

## 2016-10-07 SURGICAL SUPPLY — 24 items
CANISTER SUCT 3000ML (MISCELLANEOUS) ×3 IMPLANT
CATH KIT ON-Q SILVERSOAK 5IN (CATHETERS) ×3 IMPLANT
CHLORAPREP W/TINT 26ML (MISCELLANEOUS) ×3 IMPLANT
DRSG OPSITE POSTOP 4X10 (GAUZE/BANDAGES/DRESSINGS) ×3 IMPLANT
DRSG TELFA 3X8 NADH (GAUZE/BANDAGES/DRESSINGS) ×3 IMPLANT
ELECT REM PT RETURN 9FT ADLT (ELECTROSURGICAL) ×3
ELECTRODE REM PT RTRN 9FT ADLT (ELECTROSURGICAL) ×1 IMPLANT
GAUZE SPONGE 4X4 12PLY STRL (GAUZE/BANDAGES/DRESSINGS) ×6 IMPLANT
GOWN STRL REUS W/ TWL LRG LVL3 (GOWN DISPOSABLE) ×3 IMPLANT
GOWN STRL REUS W/TWL LRG LVL3 (GOWN DISPOSABLE) ×6
NS IRRIG 1000ML POUR BTL (IV SOLUTION) ×3 IMPLANT
PAD OB MATERNITY 4.3X12.25 (PERSONAL CARE ITEMS) ×3 IMPLANT
PAD PREP 24X41 OB/GYN DISP (PERSONAL CARE ITEMS) ×3 IMPLANT
SPONGE LAP 18X18 5 PK (GAUZE/BANDAGES/DRESSINGS) ×3 IMPLANT
SUT MNCRL 4-0 (SUTURE)
SUT MNCRL 4-0 27XMFL (SUTURE)
SUT PDS AB 1 TP1 96 (SUTURE) ×3 IMPLANT
SUT PLAIN 2 0 XLH (SUTURE) ×3 IMPLANT
SUT PLAIN GUT 2-0 30 C14 SG823 (SUTURE) ×3
SUT VIC AB 0 CT1 36 (SUTURE) ×9 IMPLANT
SUT VIC AB 3-0 SH 27 (SUTURE) ×2
SUT VIC AB 3-0 SH 27X BRD (SUTURE) ×1 IMPLANT
SUTURE MNCRL 4-0 27XMF (SUTURE) IMPLANT
SUTURE PLN GUT2-0 30 C14 SG823 (SUTURE) ×1 IMPLANT

## 2016-10-07 NOTE — Anesthesia Procedure Notes (Signed)
Spinal  Patient location during procedure: OB Start time: 10/07/2016 12:17 PM End time: 10/07/2016 12:26 PM Staffing Anesthesiologist: Randa Lynn, AMY Resident/CRNA: Nelda Marseille Performed: resident/CRNA  Preanesthetic Checklist Completed: patient identified, site marked, surgical consent, pre-op evaluation, timeout performed, IV checked, risks and benefits discussed and monitors and equipment checked Spinal Block Patient position: sitting Prep: ChloraPrep Patient monitoring: heart rate, continuous pulse ox, blood pressure and cardiac monitor Approach: midline Location: L4-5 Injection technique: single-shot Needle Needle type: Introducer and Pencil-Tip  Needle gauge: 24 G Needle length: 9 cm Additional Notes Negative paresthesia. Negative blood return. Positive free-flowing CSF. Expiration date of kit checked and confirmed. Patient tolerated procedure well, without complications.

## 2016-10-07 NOTE — Op Note (Addendum)
  Cesarean Section Procedure Note  Date of procedure: 10/07/2016   Pre-operative Diagnosis: Intrauterine pregnancy at [id number]; breech presentation  Post-operative Diagnosis: same, delivered.  Procedure: Primary Low Transverse Cesarean Section through Pfannenstiel incision  Surgeon: Christeen DouglasBethany Joeph Szatkowski, MD   Assistant(s):  Ranae Plumberhelsea Ward, MD  Anesthesia: Spinal anesthesia  Estimated Blood Loss:  700mL         Drains: NONE         Total IV Fluids: 1500ml  Urine Output: 150ml         Specimens: Cord blood for coombs test         Complications:  None; patient tolerated the procedure well.         Disposition: PACU - hemodynamically stable.         Condition: stable  Findings:  A female infant "Jocelyn" in cephalic presentation. Amniotic fluid - Meconium  Birth weight 3740 g.  Apgars of 10 and 10 at one and five minutes respectively.  Intact placenta with a three-vessel cord.  Grossly normal uterus, tubes and ovaries bilaterally. no intraabdominal adhesions were noted.  Indications: malpresentation: breech  Procedure Details  The patient was taken to Operating Room, identified as the correct patient and the procedure verified as C-Section Delivery. A formal Time Out was held with all team members present and in agreement.  After induction of spinal anesthesia, the patient was draped and prepped in the usual sterile manner. A Pfannenstiel skin incision was made and carried down through the subcutaneous tissue to the fascia. Fascial incision was made and extended transversely with the Mayo scissors. The fascia was separated from the underlying rectus tissue superiorly and inferiorly. The peritoneum was identified and entered bluntly. Peritoneal incision was extended longitudinally. The utero-vesical peritoneal reflection was incised transversely and a bladder flap was created digitally.   A low transverse hysterotomy was made. The fetus was delivered using normal breech maneuvers  atraumatically. The umbilical cord was clamped x2 and cut and the infant was handed to the awaiting pediatricians. The placenta was removed intact and appeared normal, intact, and with a 3-vessel cord.   The uterus was exteriorized and cleared of all clot and debris. The hysterotomy was closed with running sutures of 0-Vicryl. A second imbricating layer was placed with the same suture. Excellent hemostasis was observed. The peritoneal cavity was cleared of all clots and debris. The uterus was returned to the abdomen.   The pelvis was irrigated and again, excellent hemostasis was noted. The fascia was then reapproximated with running sutures of 0 Vicryl and 60ml total Exparel +bupivicaine injected along fascia.  The subcutaneous tissue was reapproximated with running sutures of chromic. The skin was reapproximated with a 4-0 Monocryl subcuticular stitch and the remaining 40ml of exparel injected here.  Instrument, sponge, and needle counts were correct prior to the abdominal closure and at the conclusion of the case.   The patient tolerated the procedure well and was transferred to the recovery room in stable condition.   Christeen DouglasBEASLEY, Devondre Guzzetta, MD 10/07/2016

## 2016-10-07 NOTE — Interval H&P Note (Signed)
History and Physical Interval Note:  10/07/2016 10:46 AM  Nicole Mayo  has presented today for surgery, with the diagnosis of breech  The various methods of treatment have been discussed with the patient and family. After consideration of risks, benefits and other options for treatment, the patient has consented to  Procedure(s): CESAREAN SECTION (N/A) as a surgical intervention .  The patient's history has been reviewed, patient examined, no change in status, stable for surgery.  I have reviewed the patient's chart and labs.  Questions were answered to the patient's satisfaction.     The risks of cesarean section discussed with the patient included but were not limited to: bleeding which may require transfusion or reoperation; infection which may require antibiotics; injury to bowel, bladder, ureters or other surrounding organs; injury to the fetus; need for additional procedures including hysterectomy in the event of a life-threatening hemorrhage; placental abnormalities wth subsequent pregnancies, incisional problems, thromboembolic phenomenon and other postoperative/anesthesia complications. The patient concurred with the proposed plan, giving informed written consent for the procedure.   Patient has been NPO since yesterday she will remain NPO for procedure. Anesthesia and OR aware. Preoperative prophylactic antibiotics and SCDs ordered on call to the OR.  To OR when ready.     Christeen DouglasBEASLEY, Riaan Toledo

## 2016-10-07 NOTE — Plan of Care (Signed)
Plan of care changed. Patient initially scheduled for an induction for Post dates.   Upon cervical  examination per Louis Meckelmeredith CNM, Sharyl NimrodMeredith noticed she could not feel the presenting part of the fetus. Sharyl NimrodMeredith performed a bedside Ultrasound and confirmed that baby was in the breech/transverse position.   Cervidil was not administered and Meredith called Dr Elesa MassedWard to discuss POC regarding patient.

## 2016-10-07 NOTE — Anesthesia Procedure Notes (Deleted)
Spinal

## 2016-10-07 NOTE — Progress Notes (Signed)
I have reviewed the US results confirming breech presentation and AFI 4.4.   IMPRESSION: 1. Single live intrauterine pregnancy. 2. Breech presentation. 3. Insufficient visualization of placenta to evaluate location or the presence of previa. 4. Suboptimal evaluation for AFI. Approximately 4.4 cm. Single largest pocket 2.8 cm. This exam is performed on an emergent basis and does not comprehensively evaluate fetal size, dating, or anatomy; follow-up complete OB US should be considered if further fetal assessment is warranted.   Electronically Signed   By: Mitzi HansenLance  Furusawa-Stratton M.D.   On: 10/07/2016 02:59  Will discuss results with Dr. Edwina BarthWard/Dr. Dalbert GarnetBeasley in AM and likely plan for primary LTCS.  Nicole Mayo, CNM

## 2016-10-07 NOTE — H&P (Signed)
OB ADMISSION/ HISTORY & PHYSICAL:  Admission Date: 10/06/2016  9:55 PM  Admit Diagnosis: Induction of Labor at 40+6 weeks for postdates   Nicole Mayo is a 36 y.o. female presenting for induction of labor at 40+6 weeks for postdates. She is dated by an 4511 week US, with EDD of 09/30/16.  She endorses good fetal movement.  She reports some pelvic pressure.   Prenatal History: G3P2   EDC : 09/30/2016, by Ultrasound  [facility]  Prenatal course complicated by eczema, AMA, obesity, Rubella Non-immune  Prenatal Labs: ABO, Rh: --/--/O POS (06/30 1449) Antibody: PENDING (01/22 0000) Rubella:  Non-Immune Varicella: Immune RPR: Nonreactive (07/07 0000)  HBsAg: Negative (07/07 0000)  HIV: Non-reactive (07/07 0000)  GTT: 102 GBS: Positive (12/15 0000)   12/07/2001 - NSVD at 40 weeks - female 9# 06/24/2007 - NSVD at 40 weeks - female 8#5oz  Flu vaccine: 07/24/16 Tdap: 07/24/16  Medical / Surgical History :  Past medical history:  Past Medical History:  Diagnosis Date  . Sciatica      Past surgical history: History reviewed. No pertinent surgical history.  Family History: History reviewed. No pertinent family history.   Social History:  reports that she has never smoked. She has never used smokeless tobacco. She reports that she does not drink alcohol or use drugs.   Allergies: Patient has no known allergies.    Current Medications at time of admission:  Prior to Admission medications   Medication Sig Start Date End Date Taking? Authorizing Provider  Prenatal Vit-Fe Fumarate-FA (MULTIVITAMIN-PRENATAL) 27-0.8 MG TABS tablet Take 1 tablet by mouth daily at 12 noon.    Historical Provider, MD     Review of Systems: Active FM Irregular ctxs, increase in pelvic pressure No LOF  / SROM  No bloody show   Physical Exam:  VS: Temperature 98.2 F (36.8 C), temperature source Oral, resp. rate 18, height 5\' 5"  (1.651 m), weight 86.2 kg (190 lb), last menstrual period  12/18/2005.  General: alert and oriented, appears calm Heart: RRR Lungs: Clear lung fields Abdomen: Gravid, soft and non-tender, non-distended / uterus: gravid, non-tender Extremities: no edema  Genitalia / VE: Dilation: Fingertip Effacement (%): Thick Station:  (unable to feel presenting part) Exam by:: Sharyl NimrodMeredith   Unable to palpate presenting part - not in pelvis, Leopold's maneuvers: difficult to palpate due to habitus  Bedside US performed by me: Breech presentation   FHR: baseline rate 140 bpm / variability moderate / accelerations + / no decelerations TOCO: rare  Assessment: 40+[redacted] weeks gestation Induction stage of labor FHR category 1 GBS Positive Breech Presentation    Plan:  1. Admit to Birth Place for Induction      - Routine labor and delivery orders 2. Fetal malpresentation - Breech      - Discussed with Dr. Elesa MassedWard - possible attempt for version in the morning, if not able, then proceed with a Primary C/S      - OB limited US ordered for AFI and confirmation of presentation  3. GBS Positive      - PCN 5 million units x 1 dose, then 3 million units every 4 hours  4. Plan to keep patient overnight on continuous monitoring      - NPO except for ice chips   Dr. Elesa MassedWard notified of admission / plan of care  Carlean JewsMeredith Cha Gomillion, CNM

## 2016-10-07 NOTE — Transfer of Care (Signed)
Immediate Anesthesia Transfer of Care Note  Patient: Nicole GuanCarmen Gongaware  Procedure(s) Performed: Procedure(s): CESAREAN SECTION (N/A)  Patient Location: PACU and Mother/Baby  Anesthesia Type:Spinal  Level of Consciousness: awake, alert  and oriented  Airway & Oxygen Therapy: Patient Spontanous Breathing  Post-op Assessment: Report given to RN and Post -op Vital signs reviewed and stable  Post vital signs: Reviewed and stable  Last Vitals:  Vitals:   10/07/16 1108 10/07/16 1352  BP: 121/81 118/83  Pulse: 75 83  Resp:  18  Temp:  36.2 C    Last Pain:  Vitals:   10/07/16 1100  TempSrc:   PainSc: 0-No pain         Complications: No apparent anesthesia complications

## 2016-10-07 NOTE — Anesthesia Post-op Follow-up Note (Cosign Needed)
Anesthesia QCDR form completed.        

## 2016-10-07 NOTE — Progress Notes (Signed)
Patient left floor at 0105 via wheelchair for AFI ultrasound.

## 2016-10-07 NOTE — Anesthesia Preprocedure Evaluation (Signed)
Anesthesia Evaluation  Patient identified by MRN, date of birth, ID band Patient awake    Reviewed: Allergy & Precautions, NPO status , Patient's Chart, lab work & pertinent test results  History of Anesthesia Complications Negative for: history of anesthetic complications  Airway Mallampati: III  TM Distance: >3 FB Neck ROM: Full    Dental no notable dental hx.    Pulmonary neg pulmonary ROS, neg sleep apnea, neg COPD,    breath sounds clear to auscultation- rhonchi (-) wheezing      Cardiovascular Exercise Tolerance: Good (-) hypertension(-) CAD and (-) Past MI  Rhythm:Regular Rate:Normal - Systolic murmurs and - Diastolic murmurs    Neuro/Psych negative neurological ROS  negative psych ROS   GI/Hepatic negative GI ROS, Neg liver ROS,   Endo/Other  negative endocrine ROSneg diabetes  Renal/GU negative Renal ROS     Musculoskeletal negative musculoskeletal ROS (+)   Abdominal (+) + obese, Gravid abdomen   Peds  Hematology negative hematology ROS (+)   Anesthesia Other Findings PLTCS for breech presentation  Reproductive/Obstetrics (+) Pregnancy                             Anesthesia Physical Anesthesia Plan  ASA: II  Anesthesia Plan: Spinal   Post-op Pain Management:    Induction:   Airway Management Planned: Natural Airway  Additional Equipment:   Intra-op Plan:   Post-operative Plan:   Informed Consent: I have reviewed the patients History and Physical, chart, labs and discussed the procedure including the risks, benefits and alternatives for the proposed anesthesia with the patient or authorized representative who has indicated his/her understanding and acceptance.   Dental advisory given  Plan Discussed with: Anesthesiologist and CRNA  Anesthesia Plan Comments:         Lab Results  Component Value Date   WBC 10.4 10/07/2016   HGB 13.2 10/07/2016   HCT  38.7 10/07/2016   MCV 84.0 10/07/2016   PLT 161 10/07/2016    Anesthesia Quick Evaluation

## 2016-10-07 NOTE — Discharge Summary (Addendum)
Obstetrical Discharge Summary  Patient Name: Nicole Mayo DOB: [birth year] MRN: [id number]  Date of Admission: 10/06/2016 Date of Discharge: 10/09/16 Primary OB: Princella Ion   Gestational Age at Delivery: [redacted]w[redacted]d  Antepartum complications: Late term pregnancy, eczema, AMA, obesity, Rubella Non-immune Admitting Diagnosis: Breech presentation Secondary Diagnosis: Patient Active Problem List   Diagnosis Date Noted  . Labor and delivery, indication for care 10/06/2016  . Positive GBS test 10/06/2016  . Decreased fetal movement 09/24/2016    Intrapartum complications/course: Pt admitted for planned iol for late term pregnancy and found to be in breech presentation with oligohydramnios. Primary LTCS performed without complication. Date of Delivery: 10/07/16 Delivered By: BBenjaman Kindler MD Delivery Type: primary cesarean section, low transverse incision Anesthesia: spinal Placenta: Extracted Laceration: none Episiotomy: none Newborn Data: Live born female "Jocelyn" Birth Weight: 8 lb 3.9 oz (3740 g) APGAR: 10, 10  Discharge Physical Exam:  lungs CTA  Cv RRR  abd soft NT  Incision C/D/I  BP 121/81   Pulse 75   Temp 97.9 F (36.6 C) (Oral)   Resp 16   Ht _0  (1.651 m)   Wt 190 lb (86.2 kg)   LMP 12/25/2015   BMI 31.62 kg/m   General: NAD CV: RRR Pulm: CTABL, nl effort ABD: s/nd/nt, fundus firm and below the umbilicus Lochia: moderate Incision: c/d/i  DVT Evaluation: LE non-ttp, no evidence of DVT on exam.  Hemoglobin  Date Value Ref Range Status  10/07/2016 13.2 12.0 - 16.0 g/dL Final   HCT  Date Value Ref Range Status  10/07/2016 38.7 35.0 - 47.0 % Final    Post partum course: uncomplicated Postpartum Procedures: none Disposition: stable, discharge to home. Baby Feeding: breastmilk and formula Baby Disposition: home with mom  Rh Immune globulin given:  Rubella vaccine given:  Tdap vaccine given in AP or PP setting: antenatal Flu vaccine given in AP  or PP setting: antenatal  Contraception: Undecided  Prenatal Labs:  O pos, Antibody neg, Rubella non-immune, RPR NR, HIV Neg, HBSag neg, GC/Ch neg, Pap normal but, hx of HPV, GBS POSITIVE   Plan:  CTarini Carrierwas discharged to home in good condition. [facility]   Discharge Medications: Percocet 5/3227m#30 Motrin 600 mg q 6 hr  Colace  MMR given before d/c  Pt interested in IUD     Signed: ThBoykin NearingD

## 2016-10-07 NOTE — Progress Notes (Signed)
Subjective: Postpartum Day 0- 2 hrs postop: Cesarean Delivery Patient reports dizziness and hunger.   Elevated Blood pressure since delivery  Objective: Vital signs in last 24 hours: Temp:  [97.2 F (36.2 C)-98.2 F (36.8 C)] 97.2 F (36.2 C) (01/22 1352) Pulse Rate:  [58-92] 66 (01/22 1515) Resp:  [13-42] 42 (01/22 1515) BP: (115-157)/(68-109) 133/109 (01/22 1515) SpO2:  [98 %-100 %] 100 % (01/22 1515) Weight:  [190 lb (86.2 kg)] 190 lb (86.2 kg) (01/21 2249)  Physical Exam:  General: alert, cooperative and appears stated age. Dizziness, but no spots or fuzziness in vision CV: RRR Pulm: CTAB x2 lung fields at the bases Upper Ext reflex: 2+ No lower Extr edema   Recent Labs  10/07/16 0000  HGB 13.2  HCT 38.7    Assessment/Plan: Elevated BP since delivery 2 hrs ago with normal reflexes and no severe sx, normal exam and normal lochia. - Monitor UOP - PreE labs: CMP ordered. U:P ordered - Serial labs  Nicole DouglasBEASLEY, Nicole Mayo 10/07/2016, 3:28 PM

## 2016-10-08 DIAGNOSIS — E669 Obesity, unspecified: Secondary | ICD-10-CM | POA: Diagnosis present

## 2016-10-08 DIAGNOSIS — D696 Thrombocytopenia, unspecified: Secondary | ICD-10-CM | POA: Diagnosis not present

## 2016-10-08 LAB — CBC
HCT: 31.1 % — ABNORMAL LOW (ref 35.0–47.0)
HEMOGLOBIN: 10.7 g/dL — AB (ref 12.0–16.0)
MCH: 29 pg (ref 26.0–34.0)
MCHC: 34.6 g/dL (ref 32.0–36.0)
MCV: 83.8 fL (ref 80.0–100.0)
Platelets: 126 10*3/uL — ABNORMAL LOW (ref 150–440)
RBC: 3.7 MIL/uL — ABNORMAL LOW (ref 3.80–5.20)
RDW: 15 % — ABNORMAL HIGH (ref 11.5–14.5)
WBC: 9.1 10*3/uL (ref 3.6–11.0)

## 2016-10-08 LAB — RPR: RPR Ser Ql: NONREACTIVE

## 2016-10-08 NOTE — Progress Notes (Signed)
Pt called out of the room, asking for RN. Upon entering the room the patient is lying in the bed chattering her teeth and complaining of pain. Upon checking the checking vital signs they are as follows BP: 157/96 HR: 73 Temp:98.4. Provided patient with scheduled pain medication and asked the patient to turn the TV off and relax for a little while, prior to me coming back to recheck vital signs. Will pass this on in report and continue to assess. Shirlean KellyJennifer Maybree Riling RN

## 2016-10-08 NOTE — Anesthesia Postprocedure Evaluation (Signed)
Anesthesia Post Note  Patient: Nicole Mayo  Procedure(s) Performed: Procedure(s) (LRB): CESAREAN SECTION (N/A)  Patient location during evaluation: Mother Baby Anesthesia Type: Spinal Level of consciousness: awake and alert Pain management: satisfactory to patient Vital Signs Assessment: post-procedure vital signs reviewed and stable Respiratory status: spontaneous breathing, nonlabored ventilation and respiratory function stable Cardiovascular status: blood pressure returned to baseline Postop Assessment: no backache, patient able to bend at knees, adequate PO intake and no signs of nausea or vomiting Anesthetic complications: no Comments: Pt c/o headache in occipital area.  Reports she gets relief when sitting up in bed, it gets worse when she lies flat.  Pt encouraged PO fluids and prn analgesia per nursing staff.      Last Vitals:  Vitals:   10/08/16 0458 10/08/16 0743  BP: 117/71 106/70  Pulse: 64 76  Resp: 20 18  Temp: 36.7 C 36.4 C    Last Pain:  Vitals:   10/08/16 0743  TempSrc: Oral  PainSc:                  Jules SchickLogan,  Tiena Manansala P

## 2016-10-08 NOTE — Anesthesia Post-op Follow-up Note (Signed)
  Anesthesia Pain Follow-up Note  Patient: Nicole Mayo  Day #: 1  Date of Follow-up: 10/08/2016 Time: 7:56 AM  Last Vitals:  Vitals:   10/08/16 0458 10/08/16 0743  BP: 117/71 106/70  Pulse: 64 76  Resp: 20 18  Temp: 36.7 C 36.4 C    Level of Consciousness: alert  Pain: mild   Side Effects:None  Catheter Site Exam:clean     Plan: D/C from anesthesia care at surgeon's request  Nicole Mayo,  Nicole Mayo

## 2016-10-09 ENCOUNTER — Encounter: Payer: Self-pay | Admitting: General Practice

## 2016-10-09 LAB — PLATELET COUNT: Platelets: 145 10*3/uL — ABNORMAL LOW (ref 150–440)

## 2016-10-09 MED ORDER — MEASLES, MUMPS & RUBELLA VAC ~~LOC~~ INJ
0.5000 mL | INJECTION | Freq: Once | SUBCUTANEOUS | Status: AC
  Administered 2016-10-09: 0.5 mL via SUBCUTANEOUS
  Filled 2016-10-09 (×2): qty 0.5

## 2016-10-09 MED ORDER — OXYCODONE-ACETAMINOPHEN 5-325 MG PO TABS: 1.0000 | ORAL_TABLET | ORAL | 0 refills | Status: DC | PRN

## 2016-10-09 MED ORDER — IBUPROFEN 600 MG PO TABS
600.0000 mg | ORAL_TABLET | Freq: Four times a day (QID) | ORAL | 0 refills | Status: AC
Start: 2016-10-09 — End: ?

## 2016-10-09 MED ORDER — DOCUSATE SODIUM 100 MG PO CAPS: 100.0000 mg | ORAL_CAPSULE | Freq: Two times a day (BID) | ORAL | 0 refills | Status: DC

## 2016-10-09 NOTE — Progress Notes (Signed)
  Subjective:  Doing well.  No complaints. tolerating regular PO diet, tolerating pain with PO meds. Has not yet voided or ambulated. Denies: CP SOB F/C, N/V, calf pain     Objective:  97.6, 18, 76, 106/70 99% RA  General: NAD Pulmonary: no increased work of breathing Abdomen: non-distended, non-tender, fundus firm at level of umbilicus Incision: bandaged, c/d/i Extremities: no edema, no erythema, no tenderness  Results for orders placed or performed during the hospital encounter of 10/06/16 (from the past 24 hour(s))  CBC     Status: Abnormal   Collection Time: 10/08/16  5:43 AM  Result Value Ref Range   WBC 9.1 3.6 - 11.0 K/uL   RBC 3.70 (L) 3.80 - 5.20 MIL/uL   Hemoglobin 10.7 (L) 12.0 - 16.0 g/dL   HCT 16.131.1 (L) 09.635.0 - 04.547.0 %   MCV 83.8 80.0 - 100.0 fL   MCH 29.0 26.0 - 34.0 pg   MCHC 34.6 32.0 - 36.0 g/dL   RDW 40.915.0 (H) 81.111.5 - 91.414.5 %   Platelets 126 (L) 150 - 440 K/uL      Assessment:   36 y.o. G3P2 postoperativeday # 1 from LTCS for BREECH   Plan:  1) Acute blood loss anemia - hemodynamically stable and asymptomatic.  Suspected Transient thrombocytopenia    2) continue routine post op cares - get OOB to void and encourage ambulation 3) assist with breastfeeding; lactation 4) f/u contraception plans 5) continue inpatient care  ----- Ranae Plumberhelsea Ward, MD Attending Obstetrician and Gynecologist [facility], Department of OB/GYN [facility]

## 2017-05-22 IMAGING — US US OB COMP +14 WK
1 series · 14 of 28 positions shown · non-contrast
Comparison: none

CLINICAL DATA: Pregnancy.

EXAM:
OBSTETRICAL ULTRASOUND >14 WKS

[Series 1: us ob comp +14 wk · 0.25mm/px · 14 of 81 slices shown]
[im 3/81]
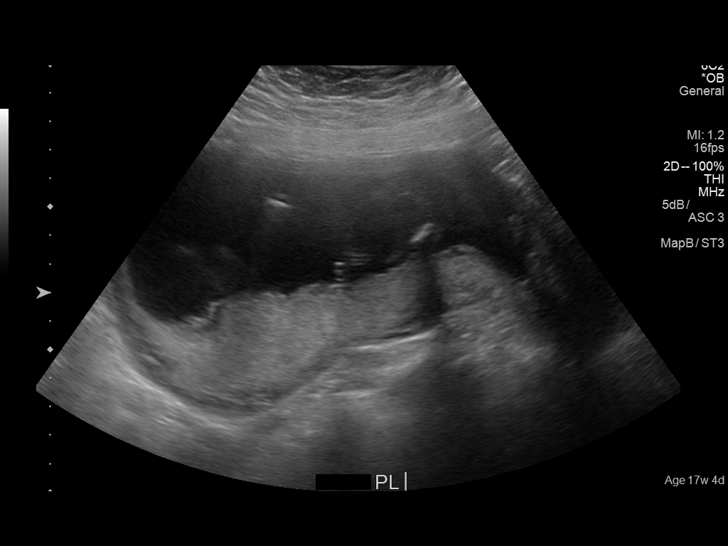
[im 9/81]
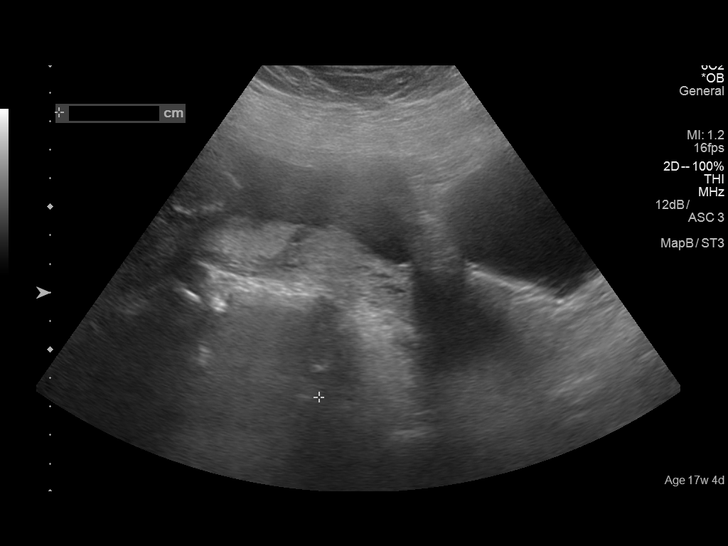
[im 15/81]
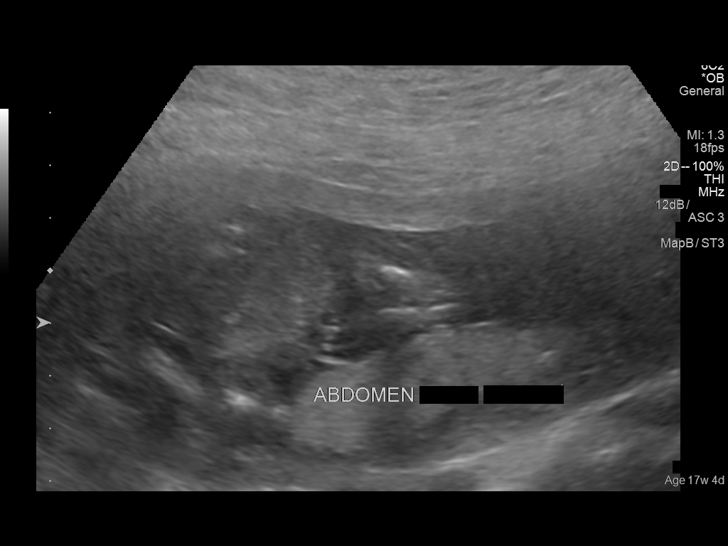
[im 21/81]
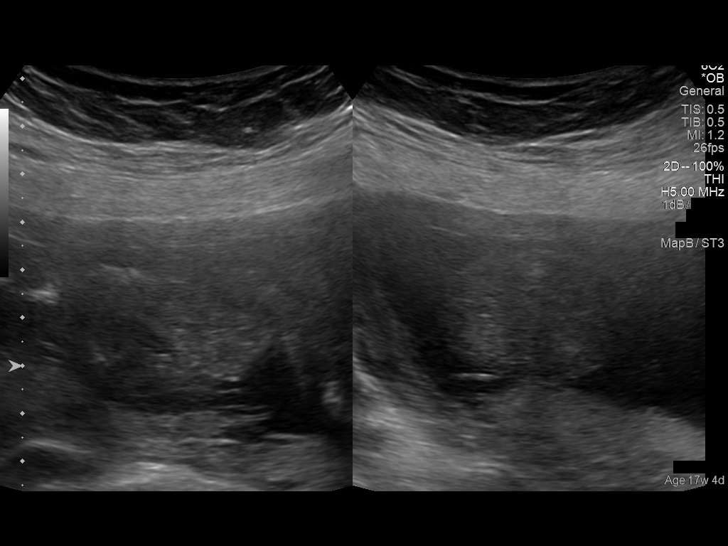
[im 27/81]
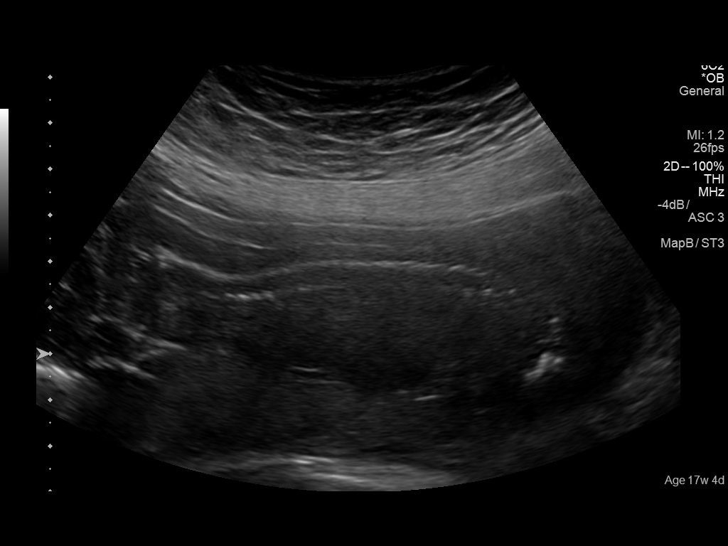
[im 33/81]
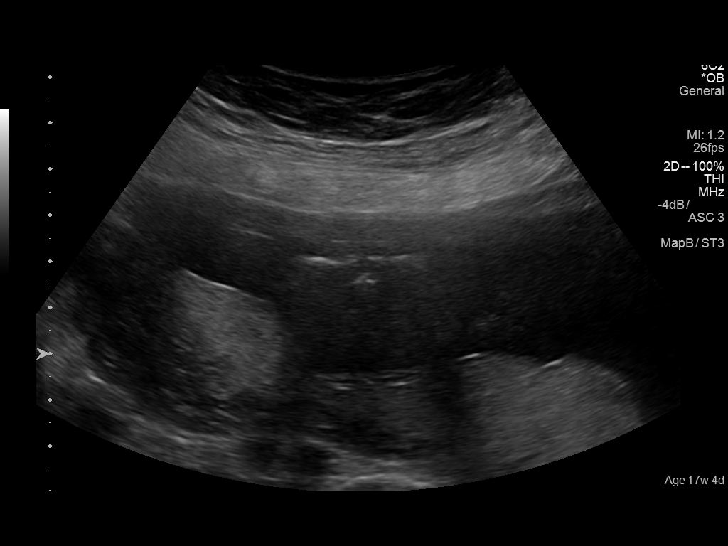
[im 39/81]
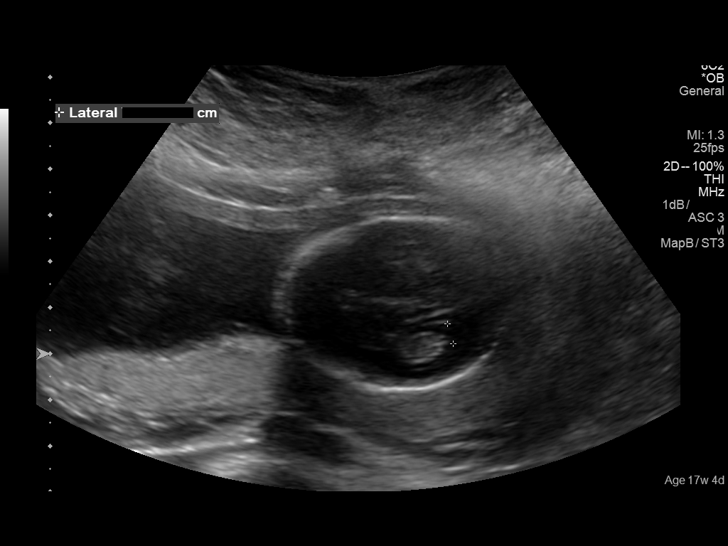
[im 45/81]
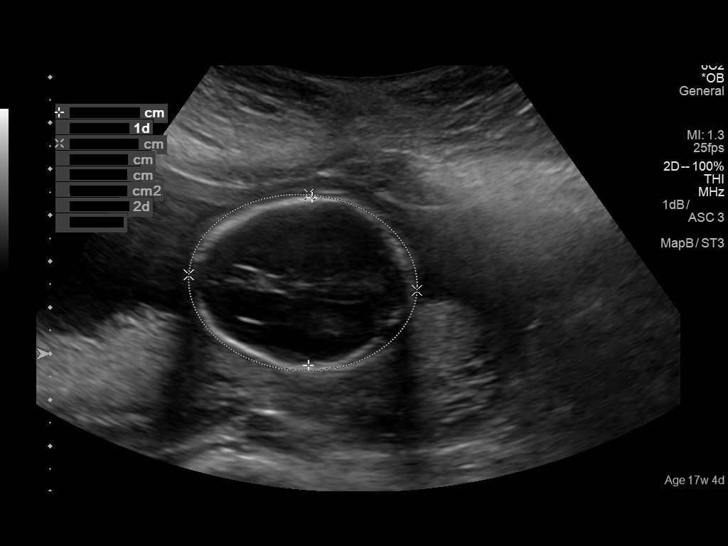
[im 51/81]
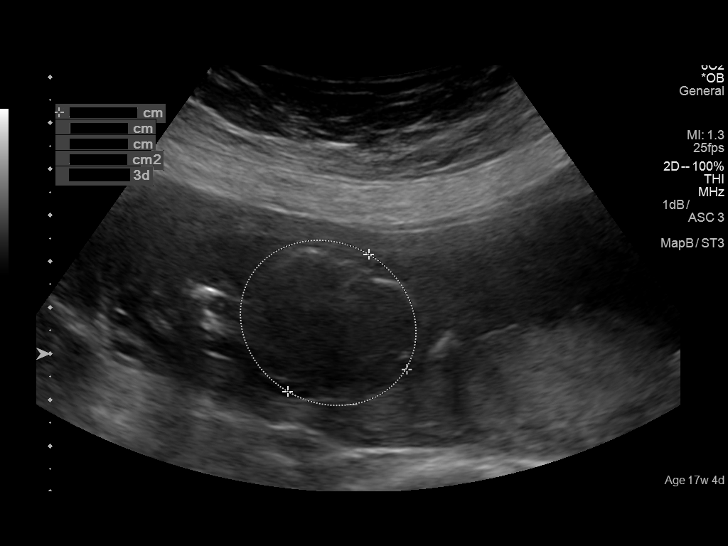
[im 57/81]
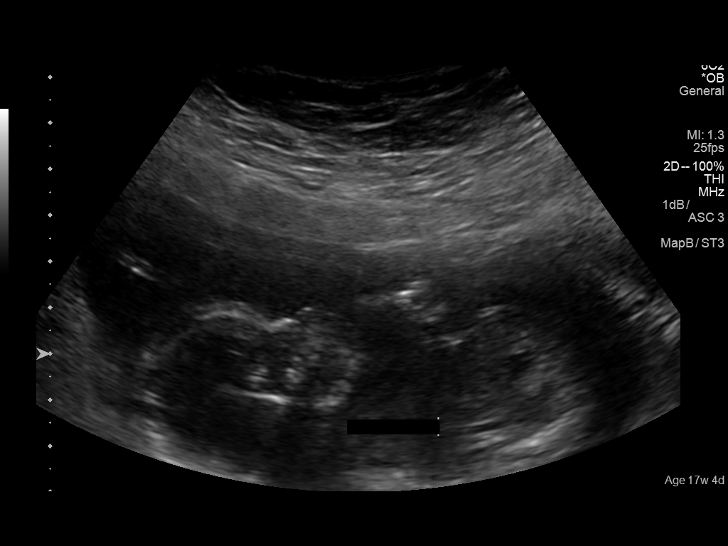
[im 63/81]
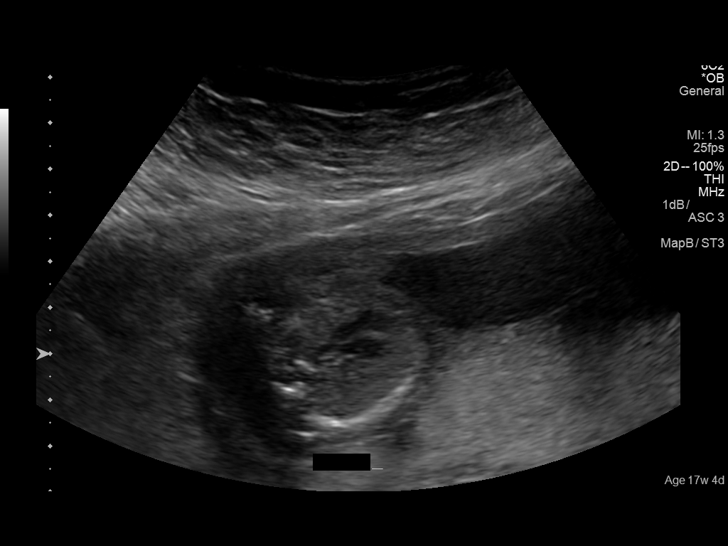
[im 69/81]
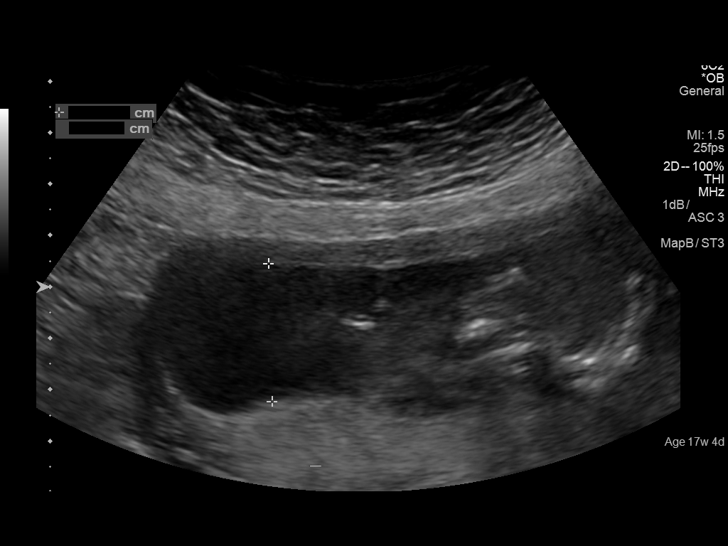
[im 75/81]
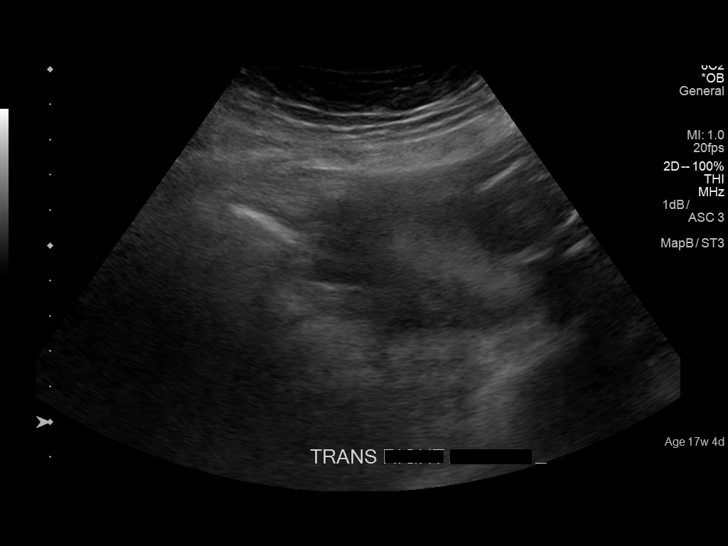
[im 81/81]
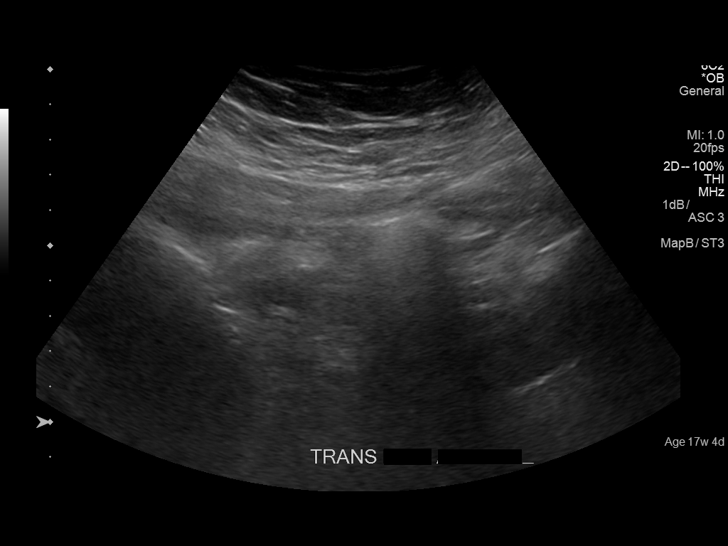

[14 of 28 positions shown; findings below may reference images not displayed]

FINDINGS: Number of Fetuses: 1

Heart Rate:  150 bpm

Movement: Present

Presentation: Breech

Previa: No

Placental Location: Posterior

Amniotic Fluid (Subjective): Normal

Amniotic Fluid (Objective):

Vertical pocket 4.2cm

FETAL BIOMETRY

BPD:  3.7cm 17w 2d

HC:    13.9cm  17w   2d

AC:   11.6cm  17w   2d

FL:   2.5cm  17w   4d

Current Mean GA: 17w 3d

FETAL ANATOMY

Lateral Ventricles: Visualized

Thalami/CSP: Visualized

Posterior Fossa:  Visualized

Nuchal Region: Visualize    NFT= 4.2mm

Upper Lip: Not visualized

Spine: Not well visualized

4 Chamber Heart on Left: Visualized

LVOT: Not visualized.  Aorta/large vessels not well visualized.

RVOT: Visualized

Stomach on Left: Visualized.  Diaphragm not visualized.

3 Vessel Cord: Visual

Cord Insertion site: Visualized

Kidneys: Visualized

Bladder: Visualized

Extremities: Visualized

Technically difficult due to: Maternal body habitus.

Maternal Findings:

Cervix:  3.8 cm and closed.
IMPRESSION: Single viable intrauterine pregnancy at 17 weeks 3 days. Fetus is in
breech presentation. Exam limited due to maternal body habitus.

## 2018-08-16 IMAGING — US US OB FOLLOW-UP
1 series · 13 of 28 positions shown · non-contrast
Comparison: none

CLINICAL DATA: Current assigned gestational age of 20 weeks 3 days.
Followup fetal anatomic survey and growth.

EXAM:
OBSTETRIC 14+ WK ULTRASOUND FOLLOW-UP

[Series 1: us ob follow-up · 0.25mm/px · 13 of 29 slices shown]
[im 2/29]
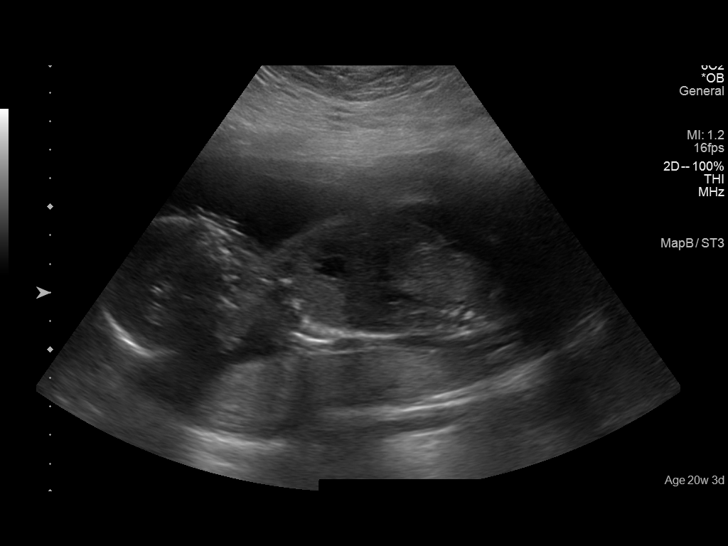
[im 4/29]
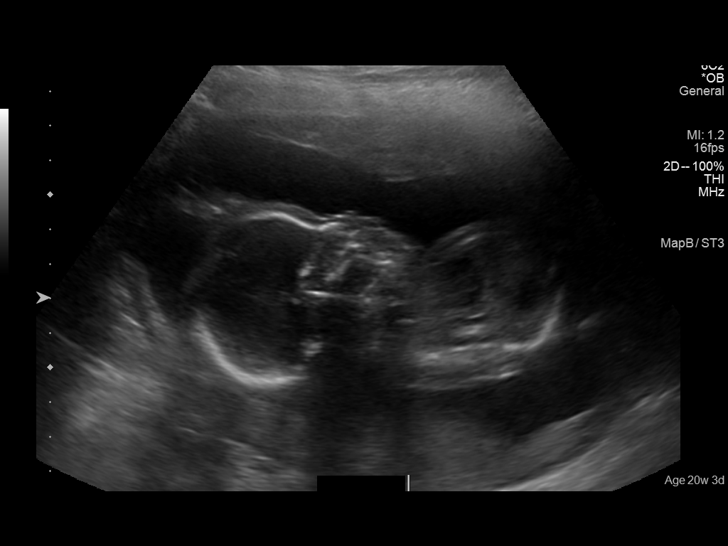
[im 6/29]
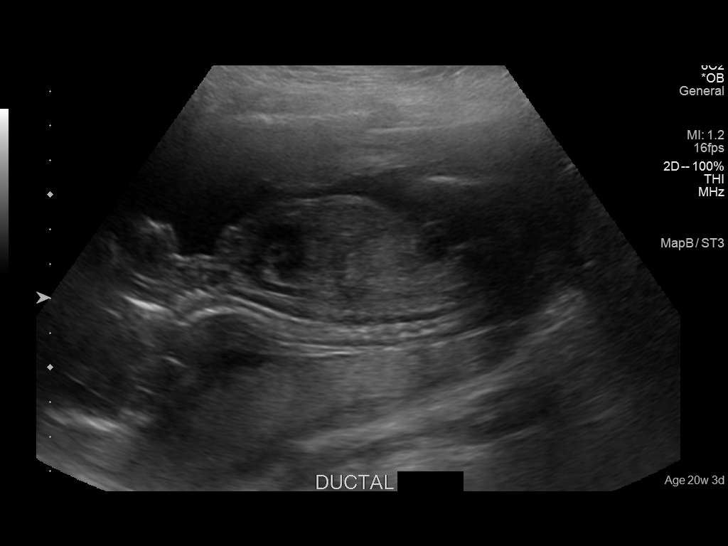
[im 8/29]
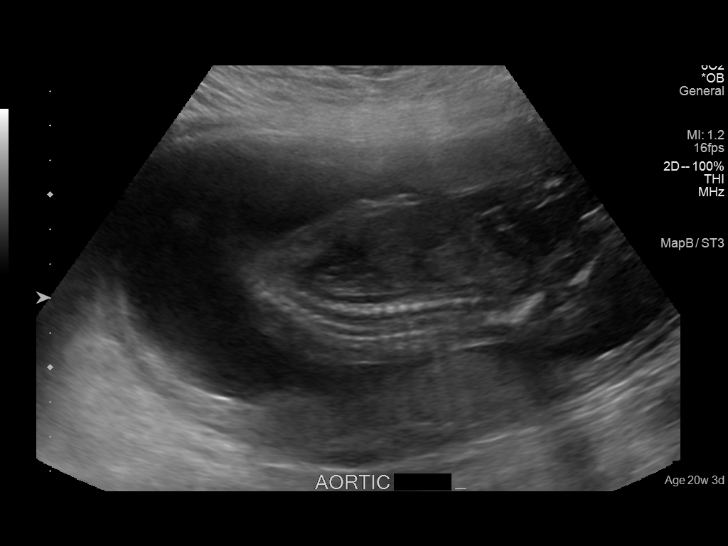
[im 10/29]
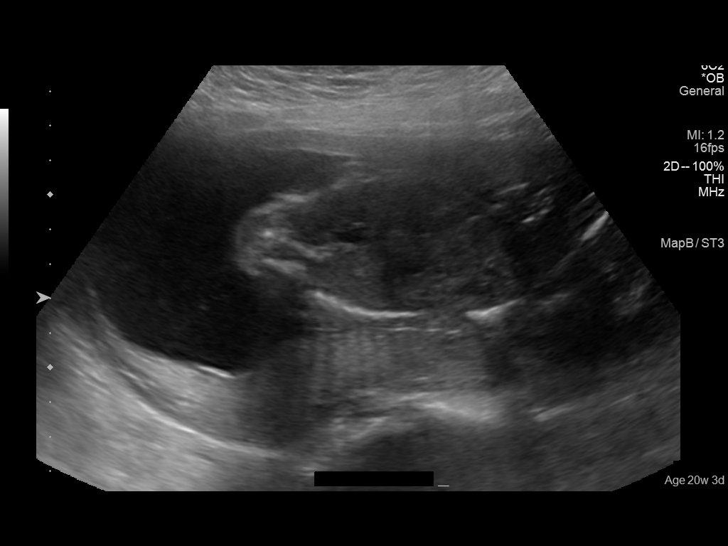
[im 12/29]
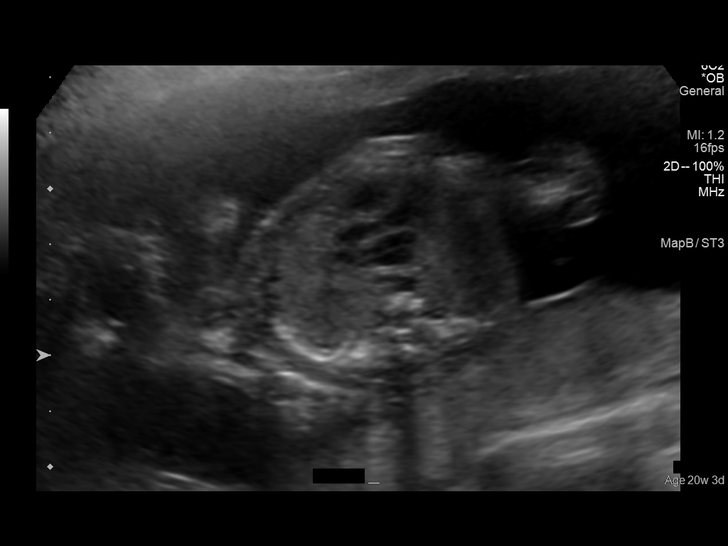
[im 15/29]
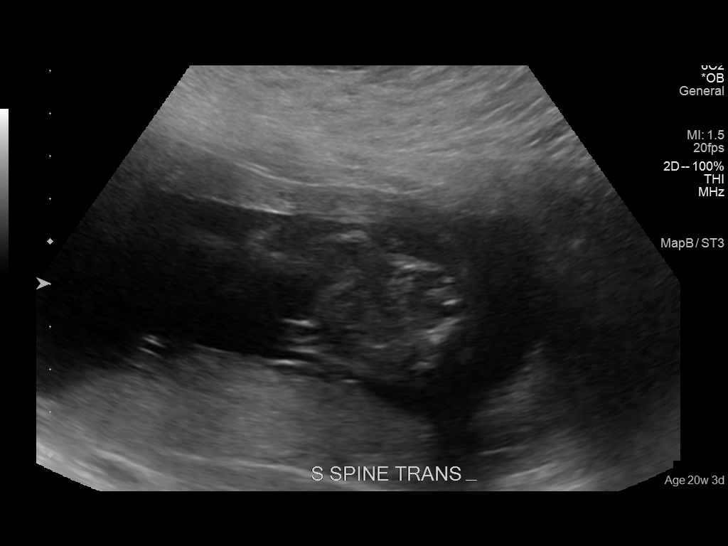
[im 17/29]
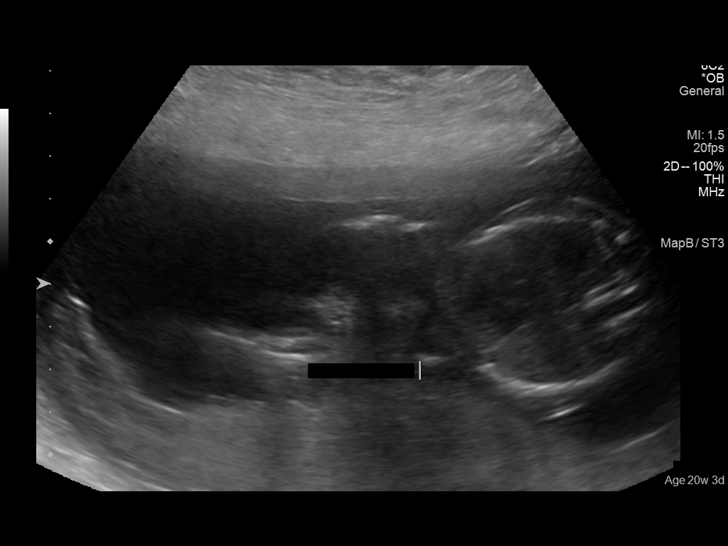
[im 19/29]
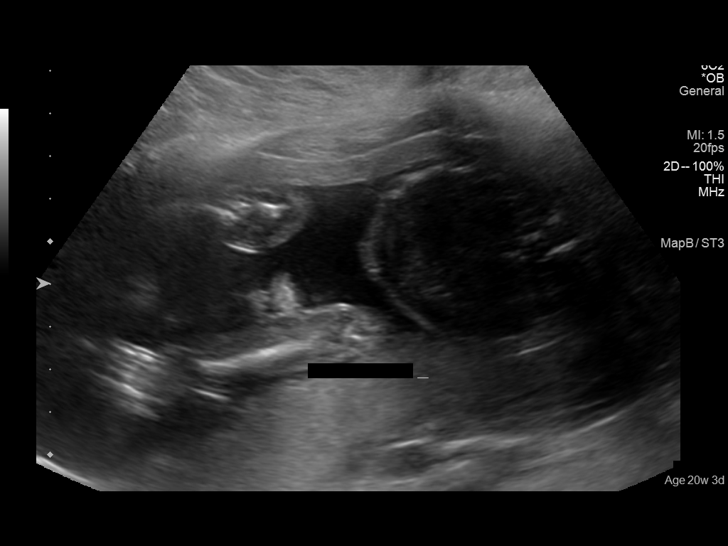
[im 21/29]
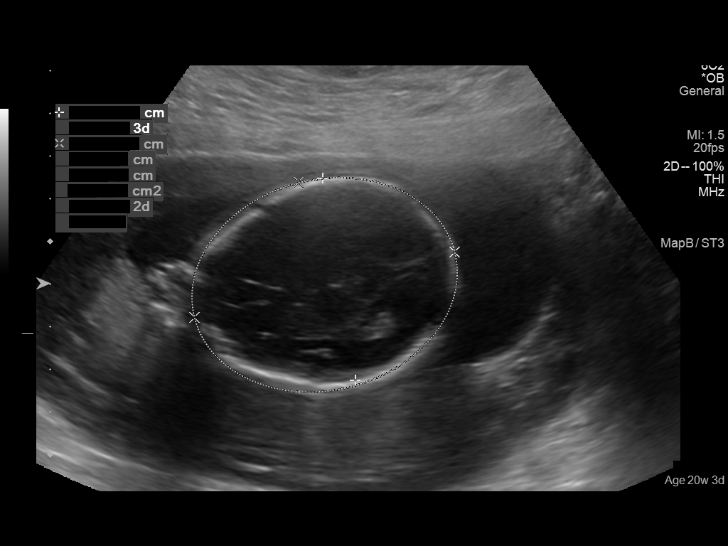
[im 23/29]
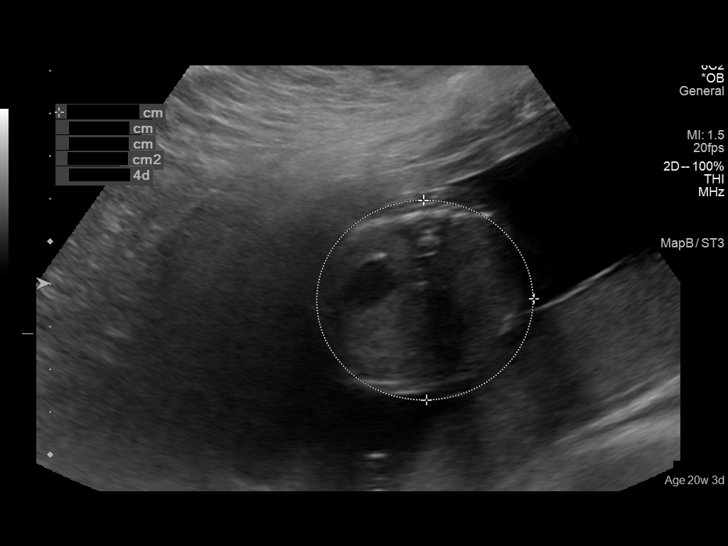
[im 25/29]
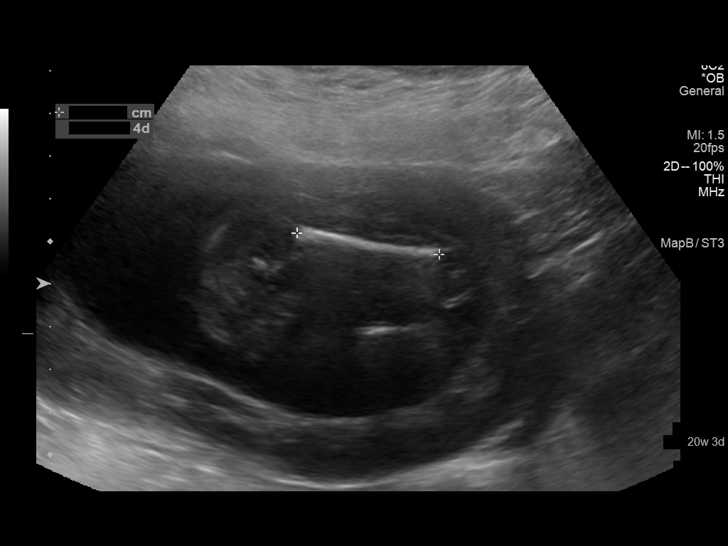
[im 27/29]
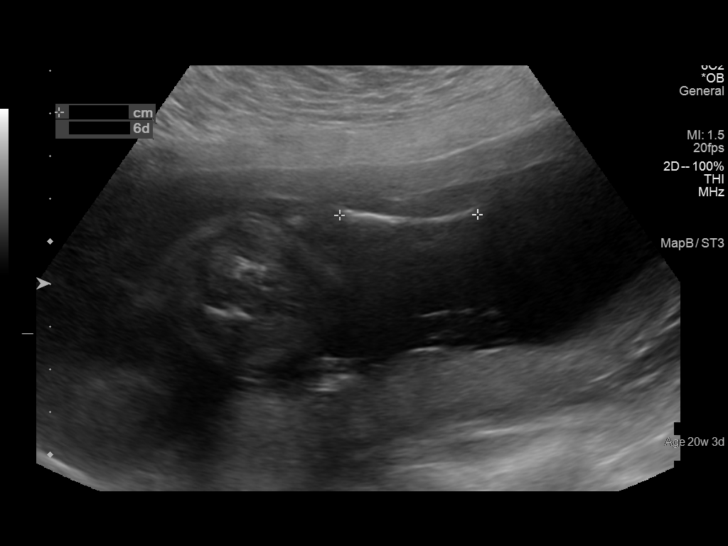

[13 of 28 positions shown; findings below may reference images not displayed]

FINDINGS: Number of Fetuses: 1

Heart Rate:  147 bpm

Movement: Yes

Presentation: Breech

Previa: None visualized

Placental Location: Posterior

Amniotic Fluid (Subjective): Within normal limits

Amniotic Fluid (Objective):

Vertical pocket 5.2cm

FETAL BIOMETRY

BPD:  4.8cm 20w   3d

HC:    17.7cm  20w   1d

AC:   15.5cm  20Jim   Mosela

NYA:   3.4cm  20w   5d

Current Mean GA: 20w 4d          US EDC: 09/19/2016

Assigned GA:  20w 3d              Assigned EDC:  09/20/2016

FETAL ANATOMY

Lateral Ventricles: Previously seen

Thalami/CSP: Appears normal

Posterior Fossa:  Previously seen

Nuchal Region: Previously seen

Upper Lip: Appears normal

Spine: Appears normal

4 Chamber Heart on Left: Previously seen

LVOT: Appears normal

RVOT: Previously seen

Stomach on Left: Previously seen

3 Vessel Cord: Previously seen

Cord Insertion site: Previously seen

Kidneys: Previously seen

Bladder: Previously seen

Extremities: Previously seen

Sex: Previously seen

MATERNAL FINDINGS:

Cervix: Not evaluated by sonographer
IMPRESSION: Single living IUP with assigned gestational age of 20 weeks 3 days.
Appropriate fetal growth.

No fetal anomalies seen involving visualized anatomy noted above.

## 2018-08-17 ENCOUNTER — Encounter: Payer: Self-pay | Admitting: *Deleted

## 2018-08-18 ENCOUNTER — Ambulatory Visit: Payer: BLUE CROSS/BLUE SHIELD | Admitting: Anesthesiology

## 2018-08-18 ENCOUNTER — Encounter
Admission: RE | Disposition: A | Discharge status: Home / Self Care | Payer: Self-pay | Source: Ambulatory Visit | Attending: Internal Medicine

## 2018-08-18 ENCOUNTER — Encounter: Payer: Self-pay | Admitting: *Deleted

## 2018-08-18 ENCOUNTER — Ambulatory Visit
Admission: RE | Admit: 2018-08-18 | Discharge: 2018-08-18 | Disposition: A | Discharge status: Home / Self Care | Payer: BLUE CROSS/BLUE SHIELD | Source: Ambulatory Visit | Attending: Internal Medicine | Admitting: Internal Medicine

## 2018-08-18 DIAGNOSIS — K449 Diaphragmatic hernia without obstruction or gangrene: Secondary | ICD-10-CM | POA: Insufficient documentation

## 2018-08-18 DIAGNOSIS — Z6834 Body mass index (BMI) 34.0-34.9, adult: Secondary | ICD-10-CM | POA: Insufficient documentation

## 2018-08-18 DIAGNOSIS — K2970 Gastritis, unspecified, without bleeding: Secondary | ICD-10-CM | POA: Diagnosis not present

## 2018-08-18 DIAGNOSIS — E669 Obesity, unspecified: Secondary | ICD-10-CM | POA: Insufficient documentation

## 2018-08-18 DIAGNOSIS — Z791 Long term (current) use of non-steroidal anti-inflammatories (NSAID): Secondary | ICD-10-CM | POA: Insufficient documentation

## 2018-08-18 DIAGNOSIS — K3189 Other diseases of stomach and duodenum: Secondary | ICD-10-CM | POA: Insufficient documentation

## 2018-08-18 DIAGNOSIS — R1013 Epigastric pain: Secondary | ICD-10-CM | POA: Diagnosis present

## 2018-08-18 HISTORY — PX: ESOPHAGOGASTRODUODENOSCOPY (EGD) WITH PROPOFOL: SHX5813

## 2018-08-18 LAB — POCT PREGNANCY, URINE: Preg Test, Ur: NEGATIVE

## 2018-08-18 SURGERY — ESOPHAGOGASTRODUODENOSCOPY (EGD) WITH PROPOFOL
Anesthesia: General

## 2018-08-18 MED ORDER — PROPOFOL 10 MG/ML IV BOLUS
INTRAVENOUS | Status: AC
  Filled 2018-08-18: qty 20

## 2018-08-18 MED ORDER — SODIUM CHLORIDE 0.9 % IV SOLN
INTRAVENOUS | Status: DC
  Administered 2018-08-18: 1000 mL via INTRAVENOUS

## 2018-08-18 MED ORDER — PROPOFOL 10 MG/ML IV BOLUS
INTRAVENOUS | Status: DC | PRN
  Administered 2018-08-18: 20 mg via INTRAVENOUS
  Administered 2018-08-18: 90 mg via INTRAVENOUS
  Administered 2018-08-18: 20 mg via INTRAVENOUS

## 2018-08-18 NOTE — Anesthesia Postprocedure Evaluation (Signed)
Anesthesia Post Note  Patient: Dawanna Haik Marry Guan Procedure(s) Performed: ESOPHAGOGASTRODUODENOSCOPY (EGD) WITH PROPOFOL (N/A )  Patient location during evaluation: Endoscopy Anesthesia Type: General Level of consciousness: awake and alert Pain management: pain level controlled Vital Signs Assessment: post-procedure vital signs reviewed and stable Respiratory status: spontaneous breathing, nonlabored ventilation, respiratory function stable and patient connected to nasal cannula oxygen Cardiovascular status: blood pressure returned to baseline and stable Postop Assessment: no apparent nausea or vomiting Anesthetic complications: no     Last Vitals:  Vitals:   08/18/18 1329 08/18/18 1339  BP: 119/88 120/84  Pulse: 68 (!) 59  Resp: 19 15  Temp:    SpO2: 97% 99%    Last Pain:  Vitals:   08/18/18 1339  TempSrc:   PainSc: 0-No pain                 Lenard SimmerAndrew Shatera Rennert

## 2018-08-18 NOTE — Transfer of Care (Signed)
Immediate Anesthesia Transfer of Care Note  Patient: Nicole Mayo  Procedure(s) Performed: ESOPHAGOGASTRODUODENOSCOPY (EGD) WITH PROPOFOL (N/A )  Patient Location: Endoscopy Unit  Anesthesia Type:General  Level of Consciousness: awake, alert  and oriented  Airway & Oxygen Therapy: Patient Spontanous Breathing and Patient connected to nasal cannula oxygen  Post-op Assessment: Report given to RN and Post -op Vital signs reviewed and stable  Post vital signs: Reviewed and stable  Last Vitals:  Vitals Value Taken Time  BP 118/77 08/18/2018  1:21 PM  Temp    Pulse 73 08/18/2018  1:21 PM  Resp 17 08/18/2018  1:21 PM  SpO2 100 % 08/18/2018  1:21 PM    Last Pain:  Vitals:   08/18/18 1319  TempSrc: (P) Temporal  PainSc:          Complications: No apparent anesthesia complications

## 2018-08-18 NOTE — Op Note (Signed)
[facility] - Inpatient Gastroenterology Patient Name: Nicole Mayo Procedure Date: 08/18/2018 12:34 PM MRN: [id number] Account #: [id number] Date of Birth: 1981/09/12 Admit Type: Outpatient Age: 37 Room: [facility] ENDO ROOM 2 Gender: Female Note Status: Finalized Procedure:            Upper GI endoscopy Indications:          Epigastric abdominal pain, Suspected esophageal reflux,                        Nausea Providers:            Boykin Nearing. Norma Fredrickson MD, MD Referring MD:         Leotis Shames (Referring MD) Medicines:            Propofol per Anesthesia Complications:        No immediate complications. Procedure:            Pre-Anesthesia Assessment:                       - The risks and benefits of the procedure and the                        sedation options and risks were discussed with the                        patient. All questions were answered and informed                        consent was obtained.                       - Patient identification and proposed procedure were                        verified prior to the procedure by the nurse. The                        procedure was verified in the procedure room.                       - ASA Grade Assessment: II - A patient with mild                        systemic disease.                       - After reviewing the risks and benefits, the patient                        was deemed in satisfactory condition to undergo the                        procedure.                       After obtaining informed consent, the endoscope was                        passed under direct vision. Throughout the procedure,                        the  patient's blood pressure, pulse, and oxygen                        saturations were monitored continuously. The Endoscope                        was introduced through the mouth, and advanced to the                        third part of duodenum. The upper GI endoscopy was   accomplished without difficulty. The patient tolerated                        the procedure well. Findings:      The examined esophagus was normal.      Localized mildly erythematous mucosa without bleeding was found in the       gastric antrum. Biopsies were taken with a cold forceps for Helicobacter       pylori testing.      The examined duodenum was normal.      The exam was otherwise without abnormality.      A 1 cm hiatal hernia was present. Impression:           - Normal esophagus.                       - Erythematous mucosa in the antrum. Biopsied.                       - Normal examined duodenum.                       - The examination was otherwise normal. Recommendation:       - Patient has a contact number available for                        emergencies. The signs and symptoms of potential                        delayed complications were discussed with the patient.                        Return to normal activities tomorrow. Written discharge                        instructions were provided to the patient.                       - Resume previous diet.                       - Continue present medications.                       - The findings and recommendations were discussed with                        the patient and their family.                       - Return to physician assistant in 3 months. Procedure Code(s):    --- Professional ---  [id number], Esophagogastroduodenoscopy, flexible, transoral;                        with biopsy, single or multiple Diagnosis Code(s):    --- Professional ---                       R11.0, Nausea                       R10.13, Epigastric pain                       K31.89, Other diseases of stomach and duodenum CPT copyright 2018 [employer]. All rights reserved. The codes documented in this report are preliminary and upon coder review may  be revised to meet current compliance requirements. Stanton Kidneyeodoro K  Hildegarde Dunaway MD, MD 08/18/2018 1:15:26 PM This report has been signed electronically. Number of Addenda: 0 Note Initiated On: 08/18/2018 12:34 PM      [facility]

## 2018-08-18 NOTE — Anesthesia Preprocedure Evaluation (Signed)
Anesthesia Evaluation  Patient identified by MRN, date of birth, ID band Patient awake    Reviewed: Allergy & Precautions, NPO status , Patient's Chart, lab work & pertinent test results  History of Anesthesia Complications Negative for: history of anesthetic complications  Airway Mallampati: III  TM Distance: >3 FB Neck ROM: Full    Dental no notable dental hx. (+) Dental Advidsory Given   Pulmonary neg pulmonary ROS, neg sleep apnea, neg COPD,           Cardiovascular Exercise Tolerance: Good (-) hypertension(-) CAD and (-) Past MI  - Systolic murmurs    Neuro/Psych negative neurological ROS  negative psych ROS   GI/Hepatic Neg liver ROS, GERD  ,  Endo/Other  negative endocrine ROSneg diabetes  Renal/GU negative Renal ROS     Musculoskeletal negative musculoskeletal ROS (+)   Abdominal (+) + obese, Gravid abdomen   Peds  Hematology negative hematology ROS (+)   Anesthesia Other Findings Past Medical History: No date: Sciatica   Reproductive/Obstetrics negative OB ROS                             Anesthesia Physical  Anesthesia Plan  ASA: II  Anesthesia Plan: General   Post-op Pain Management:    Induction: Intravenous  PONV Risk Score and Plan: 3 and Propofol infusion and TIVA  Airway Management Planned: Natural Airway and Nasal Cannula  Additional Equipment:   Intra-op Plan:   Post-operative Plan:   Informed Consent: I have reviewed the patients History and Physical, chart, labs and discussed the procedure including the risks, benefits and alternatives for the proposed anesthesia with the patient or authorized representative who has indicated his/her understanding and acceptance.   Dental advisory given  Plan Discussed with: Anesthesiologist and CRNA  Anesthesia Plan Comments:         Lab Results  Component Value Date   WBC 9.1 10/08/2016   HGB 10.7 (L)  10/08/2016   HCT 31.1 (L) 10/08/2016   MCV 83.8 10/08/2016   PLT 145 (L) 10/09/2016    Anesthesia Quick Evaluation

## 2018-08-18 NOTE — Interval H&P Note (Signed)
History and Physical Interval Note:  08/18/2018 1:07 PM  Nicole Mayo  has presented today for surgery, with the diagnosis of EPIGASTRIC PAIN  The various methods of treatment have been discussed with the patient and family. After consideration of risks, benefits and other options for treatment, the patient has consented to  Procedure(s): ESOPHAGOGASTRODUODENOSCOPY (EGD) WITH PROPOFOL (N/A) as a surgical intervention .  The patient's history has been reviewed, patient examined, no change in status, stable for surgery.  I have reviewed the patient's chart and labs.  Questions were answered to the patient's satisfaction.     [place]edo, Merrilleodoro

## 2018-08-18 NOTE — Anesthesia Post-op Follow-up Note (Signed)
Anesthesia QCDR form completed.        

## 2018-08-18 NOTE — H&P (Signed)
Outpatient short stay form Pre-procedure 08/18/2018 1:06 PM Nicole Mayo K. [name], M.D.  Primary Physician: Leotis ShamesJasmine Singh, M.D.  Reason for visit:  Epigastric pain, GERD  History of present illness:  Patient with intermittent nausea c/o epigastric pain radiating to both lower quadrants. No melena or hemetemesis. GERD symptoms , but not pain, is controlled with protonix. No dysphagia or weight loss.     Current Facility-Administered Medications:  .  0.9 %  sodium chloride infusion, , Intravenous, Continuous, [place]oledo, Boykin Nearingeodoro K, MD, Last Rate: 20 mL/hr at 08/18/18 1247, 1,000 mL at 08/18/18 1247  Medications Prior to Admission  Medication Sig Dispense Refill Last Dose  . fluticasone (FLONASE) 50 MCG/ACT nasal spray Place 2 sprays into both nostrils daily.     . pantoprazole (PROTONIX) 40 MG tablet Take 40 mg by mouth 2 (two) times daily.     . sucralfate (CARAFATE) 1 g tablet Take 1 g by mouth 2 (two) times daily.     Marland Kitchen. docusate sodium (COLACE) 100 MG capsule Take 1 capsule (100 mg total) by mouth 2 (two) times daily. 30 capsule 0   . ibuprofen (ADVIL,MOTRIN) 600 MG tablet Take 1 tablet (600 mg total) by mouth every 6 (six) hours. 30 tablet 0   . oxyCODONE-acetaminophen (PERCOCET/ROXICET) 5-325 MG tablet Take 1 tablet by mouth every 4 (four) hours as needed (pain scale 4-7). 30 tablet 0   . Prenatal Vit-Fe Fumarate-FA (MULTIVITAMIN-PRENATAL) 27-0.8 MG TABS tablet Take 1 tablet by mouth daily at 12 noon.   09/23/2016 at Unknown time     No Known Allergies   Past Medical History:  Diagnosis Date  . Sciatica     Review of systems:  Otherwise negative.    Physical Exam  Gen: Alert, oriented. Appears stated age.  HEENT: [place]/AT. PERRLA. Lungs: CTA, no wheezes. CV: RR nl S1, S2. Abd: soft, benign, no masses. BS+ Ext: No edema. Pulses 2+    Planned procedures: Proceed with EGD. The patient understands the nature of the planned procedure, indications, risks, alternatives and potential  complications including but not limited to bleeding, infection, perforation, damage to internal organs and possible oversedation/side effects from anesthesia. The patient agrees and gives consent to proceed.  Please refer to procedure notes for findings, recommendations and patient disposition/instructions.     Sully Manzi K. [name], M.D. Gastroenterology 08/18/2018  1:06 PM

## 2018-08-19 ENCOUNTER — Encounter: Payer: Self-pay | Admitting: Internal Medicine

## 2018-08-19 LAB — SURGICAL PATHOLOGY

## 2018-08-25 ENCOUNTER — Ambulatory Visit: Payer: BLUE CROSS/BLUE SHIELD | Admitting: Speech Pathology

## 2018-08-26 ENCOUNTER — Ambulatory Visit: Payer: BLUE CROSS/BLUE SHIELD | Attending: Unknown Physician Specialty | Admitting: Speech Pathology

## 2018-08-31 ENCOUNTER — Ambulatory Visit: Payer: BLUE CROSS/BLUE SHIELD | Admitting: Speech Pathology

## 2018-09-07 ENCOUNTER — Ambulatory Visit: Payer: BLUE CROSS/BLUE SHIELD | Admitting: Speech Pathology

## 2019-02-27 ENCOUNTER — Emergency Department
Admission: EM | Admit: 2019-02-27 | Discharge: 2019-02-27 | Disposition: A | Discharge status: Home / Self Care | Payer: BC Managed Care – PPO | Attending: Emergency Medicine | Admitting: Emergency Medicine

## 2019-02-27 ENCOUNTER — Other Ambulatory Visit: Payer: Self-pay

## 2019-02-27 DIAGNOSIS — Z20822 Contact with and (suspected) exposure to covid-19: Secondary | ICD-10-CM

## 2019-02-27 DIAGNOSIS — U071 COVID-19: Secondary | ICD-10-CM | POA: Diagnosis not present

## 2019-02-27 DIAGNOSIS — Z79899 Other long term (current) drug therapy: Secondary | ICD-10-CM | POA: Diagnosis not present

## 2019-02-27 DIAGNOSIS — M7918 Myalgia, other site: Secondary | ICD-10-CM | POA: Diagnosis present

## 2019-02-27 NOTE — ED Provider Notes (Signed)
[facility] Emergency Department Provider Note   ____________________________________________   I have reviewed the triage vital signs and the nursing notes.   HISTORY  Chief Complaint Generalized Body Aches   History limited by: Not Limited   HPI Nicole Mayo is a 38 y.o. female who presents to the emergency department today because of concern for possible covid infection. The patient states that someone in her work has recently tested positive.  She says that she started having body aches today.  She is also had some headache and dry cough.  She denies any fevers.  Denies any history of asthma or lung disease.   Records reviewed. Per medical record review patient has a history of sciatica.   Past Medical History:  Diagnosis Date  . Sciatica     Patient Active Problem List   Diagnosis Date Noted  . Thrombocytopenia (HCC) 10/08/2016  . Obesity 10/08/2016  . Labor and delivery, indication for care 10/06/2016  . Positive GBS test 10/06/2016  . Decreased fetal movement 09/24/2016    Past Surgical History:  Procedure Laterality Date  . CESAREAN SECTION N/A 10/07/2016   Procedure: CESAREAN SECTION;  Surgeon: Christeen DouglasBethany Beasley, MD;  Location: [facility] ORS;  Service: Obstetrics;  Laterality: N/A;  . ESOPHAGOGASTRODUODENOSCOPY (EGD) WITH PROPOFOL N/A 08/18/2018   Procedure: ESOPHAGOGASTRODUODENOSCOPY (EGD) WITH PROPOFOL;  Surgeon: Toledo, Boykin Nearingeodoro K, MD;  Location: [facility] ENDOSCOPY;  Service: Gastroenterology;  Laterality: N/A;    Prior to Admission medications   Medication Sig Start Date End Date Taking? Authorizing Provider  docusate sodium (COLACE) 100 MG capsule Take 1 capsule (100 mg total) by mouth 2 (two) times daily. 10/09/16   Schermerhorn, Ihor Austinhomas J, MD  fluticasone (FLONASE) 50 MCG/ACT nasal spray Place 2 sprays into both nostrils daily.    [provider]  ibuprofen (ADVIL,MOTRIN) 600 MG tablet Take 1 tablet (600 mg total) by mouth every 6 (six)  hours. 10/09/16   Schermerhorn, Ihor Austinhomas J, MD  oxyCODONE-acetaminophen (PERCOCET/ROXICET) 5-325 MG tablet Take 1 tablet by mouth every 4 (four) hours as needed (pain scale 4-7). 10/09/16   Schermerhorn, Ihor Austinhomas J, MD  pantoprazole (PROTONIX) 40 MG tablet Take 40 mg by mouth 2 (two) times daily.    [provider]  Prenatal Vit-Fe Fumarate-FA (MULTIVITAMIN-PRENATAL) 27-0.8 MG TABS tablet Take 1 tablet by mouth daily at 12 noon.    [provider]  sucralfate (CARAFATE) 1 g tablet Take 1 g by mouth 2 (two) times daily.    [provider]    Allergies Patient has no known allergies.  Family History  Problem Relation Age of Onset  . Hypertension Mother   . Hypertension Father     Social History Social History   Tobacco Use  . Smoking status: Never Smoker  . Smokeless tobacco: Never Used  Substance Use Topics  . Alcohol use: Yes    Comment: socially, not currently   . Drug use: No    Review of Systems Constitutional: No fever/chills Eyes: No visual changes. ENT: No sore throat. Cardiovascular: Denies chest pain. Respiratory: Positive for dry cough.  Gastrointestinal: No abdominal pain.  No nausea, no vomiting.  No diarrhea.   Genitourinary: Negative for dysuria. Musculoskeletal: Positive for headaches.  Skin: Negative for rash. Neurological: Positive for headache.   ____________________________________________   PHYSICAL EXAM:  VITAL SIGNS: ED Triage Vitals  Enc Vitals Group     BP 02/27/19 1945 (!) 128/98     Pulse Rate 02/27/19 1945 83     Resp 02/27/19  1945 17     Temp 02/27/19 1945 98 F (36.7 C)     Temp src --      SpO2 02/27/19 1945 99 %     Weight 02/27/19 1946 180 lb (81.6 kg)     Height 02/27/19 1946 5' (1.524 m)     Head Circumference --      Peak Flow --      Pain Score 02/27/19 1946 9   Constitutional: Alert and oriented.  Eyes: Conjunctivae are normal.  ENT      Head: Normocephalic and atraumatic.      Nose: No  congestion/rhinnorhea.      Mouth/Throat: Mucous membranes are moist.      Neck: No stridor. Hematological/Lymphatic/Immunilogical: No cervical lymphadenopathy. Cardiovascular: Normal rate, regular rhythm.  No murmurs, rubs, or gallops.  Respiratory: Normal respiratory effort without tachypnea nor retractions. Breath sounds are clear and equal bilaterally. No wheezes/rales/rhonchi. Gastrointestinal: Soft and non tender. No rebound. No guarding.  Genitourinary: Deferred Musculoskeletal: Normal range of motion in all extremities. No lower extremity edema. Neurologic:  Normal speech and language. No gross focal neurologic deficits are appreciated.  Skin:  Skin is warm, dry and intact. No rash noted. Psychiatric: Mood and affect are normal. Speech and behavior are normal. Patient exhibits appropriate insight and judgment.  ____________________________________________    LABS (pertinent positives/negatives)  COVID sent out  ____________________________________________   EKG  None  ____________________________________________    RADIOLOGY  None  ____________________________________________   PROCEDURES  Procedures  ____________________________________________   INITIAL IMPRESSION / ASSESSMENT AND PLAN / ED COURSE  Pertinent labs & imaging results that were available during my care of the patient were reviewed by me and considered in my medical decision making (see chart for details).   Patient presented to the emergency department today because of concerns for possible COVID infection.  She states that someone at her job did test positive.  The patient does have symptoms of body aches, headache and dry cough.  Will send out COVID test.  Discussed self-isolation with patient.   ____________________________________________   FINAL CLINICAL IMPRESSION(S) / ED DIAGNOSES  Final diagnoses:  Suspected Covid-19 Virus Infection     Note: This dictation was prepared with  Dragon dictation. Any transcriptional errors that result from this process are unintentional     [name], MD 02/27/19 2110

## 2019-02-27 NOTE — Discharge Instructions (Addendum)
Please seek medical attention for any high fevers, chest pain, shortness of breath, change in behavior, persistent vomiting, bloody stool or any other new or concerning symptoms.  

## 2019-02-27 NOTE — ED Triage Notes (Signed)
Patient c/o body aches and headache. Patient reports that she works with someone that just tested positive for COVID.

## 2019-03-01 ENCOUNTER — Telehealth: Payer: Self-pay | Admitting: Emergency Medicine

## 2019-03-01 ENCOUNTER — Telehealth: Payer: Self-pay | Admitting: *Deleted

## 2019-03-01 NOTE — Telephone Encounter (Signed)
Called patient to inform of positive covid 19 test.  Explained selft isolation and keeping asymptomatic family members isolated separately in house.  She asked about testing for family and I gave her achd CV hotline number to call in am and ask if there is a need to test her family.  She did say there were others at her work who had it.

## 2019-03-01 NOTE — Telephone Encounter (Signed)
Received call from "[facility]" stating pt had called there for clarification of COvid results.  Resulted positive.  Reviewed results with pt; reviewed precaution regarding self isolation. Pt states she has been following precautions. TN called Faylene Kurtz and made aware of results. Unable to route encounter.

## 2019-03-02 LAB — NOVEL CORONAVIRUS, NAA (HOSP ORDER, SEND-OUT TO REF LAB; TAT 18-24 HRS): SARS-CoV-2, NAA: DETECTED — AB

## 2020-09-14 LAB — RESULTS CONSOLE HPV: CHL HPV: NEGATIVE

## 2020-09-14 LAB — HM PAP SMEAR: HM Pap smear: NORMAL

## 2021-02-09 ENCOUNTER — Telehealth: Payer: Self-pay

## 2021-02-09 NOTE — Telephone Encounter (Signed)
Copied from CRM [id number]. Topic: Appointment Scheduling - Scheduling Inquiry for Clinic >> Feb 08, 2021 11:15 AM Traci Sermon wrote: Reason for CRM: patient needs a new patient appt, she is experience headaches that come and go, pt failed dt and requesting to have a call back to schedule an appt.

## 2021-03-13 ENCOUNTER — Encounter: Payer: Self-pay | Admitting: Internal Medicine

## 2021-03-13 ENCOUNTER — Other Ambulatory Visit: Payer: Self-pay

## 2021-03-13 ENCOUNTER — Ambulatory Visit (INDEPENDENT_AMBULATORY_CARE_PROVIDER_SITE_OTHER): Payer: 59 | Admitting: Internal Medicine

## 2021-03-13 VITALS — BP 137/99 | HR 75 | Temp 97.5°F | Resp 17 | Ht 60.0 in | Wt 183.6 lb

## 2021-03-13 DIAGNOSIS — G43909 Migraine, unspecified, not intractable, without status migrainosus: Secondary | ICD-10-CM | POA: Insufficient documentation

## 2021-03-13 DIAGNOSIS — G43C1 Periodic headache syndromes in child or adult, intractable: Secondary | ICD-10-CM | POA: Diagnosis not present

## 2021-03-13 DIAGNOSIS — R030 Elevated blood-pressure reading, without diagnosis of hypertension: Secondary | ICD-10-CM | POA: Diagnosis not present

## 2021-03-13 DIAGNOSIS — Z6835 Body mass index (BMI) 35.0-35.9, adult: Secondary | ICD-10-CM

## 2021-03-13 DIAGNOSIS — H9202 Otalgia, left ear: Secondary | ICD-10-CM

## 2021-03-13 MED ORDER — TOPIRAMATE 25 MG PO TABS: 25.0000 mg | ORAL_TABLET | Freq: Every evening | ORAL | 1 refills | Status: DC

## 2021-03-13 NOTE — Assessment & Plan Note (Signed)
Encouraged diet and exercise for weight loss ?

## 2021-03-13 NOTE — Progress Notes (Signed)
HPI  Pt presents to the clinic today to establish care and for management of the conditions listed below.   Migraines: Chronic. These occur about 4 times per week. She is not sure what triggers them. She describes the pain as throbbing and heavy on the left side of her head. She reports intermittent dizziness, sensitivity to light and sound, nausea and vomiting. She reports the pain radiates into the base of her neck. She reports she has taken medication for this in the past but it made her too sleepy. She is taking Ibuprofen OTC with minimal relief of symptoms. She has not family history of brain tumors or brain aneurysms. She has never have imaging of her head or seen a neurologist in the past for the same.  Past Medical History:  Diagnosis Date   Sciatica     Current Outpatient Medications  Medication Sig Dispense Refill   ibuprofen (ADVIL,MOTRIN) 600 MG tablet Take 1 tablet (600 mg total) by mouth every 6 (six) hours. 30 tablet 0   pantoprazole (PROTONIX) 40 MG tablet Take 40 mg by mouth 2 (two) times daily. (Patient not taking: Reported on 03/13/2021)     No current facility-administered medications for this visit.    No Known Allergies  Family History  Problem Relation Age of Onset   Hypertension Mother    Hypertension Father     Social History   Socioeconomic History   Marital status: Single    Spouse name: Not on file   Number of children: Not on file   Years of education: Not on file   Highest education level: Not on file  Occupational History   Not on file  Tobacco Use   Smoking status: Never   Smokeless tobacco: Never  Vaping Use   Vaping Use: Never used  Substance and Sexual Activity   Alcohol use: Yes    Comment: socially, not currently    Drug use: No   Sexual activity: Not Currently  Other Topics Concern   Not on file  Social History Narrative   Not on file   Social Determinants of Health   Financial Resource Strain: Not on file  Food Insecurity:  Not on file  Transportation Needs: Not on file  Physical Activity: Not on file  Stress: Not on file  Social Connections: Not on file  Intimate Partner Violence: Not on file    ROS:  Constitutional: Pt reports headaches. Denies fever, malaise, fatigue, or abrupt weight changes.  HEENT: Pt reports sinus pressure, left ear pain. Denies eye pain, eye redness, ringing in the ears, wax buildup, runny nose, nasal congestion, bloody nose, or sore throat. Respiratory: Denies difficulty breathing, shortness of breath, cough or sputum production.   Cardiovascular: Denies chest pain, chest tightness, palpitations or swelling in the hands or feet.  Gastrointestinal: Denies abdominal pain, bloating, constipation, diarrhea or blood in the stool.  GU: Denies frequency, urgency, pain with urination, blood in urine, odor or discharge. Musculoskeletal: Denies decrease in range of motion, difficulty with gait, muscle pain or joint pain and swelling.  Skin: Denies redness, rashes, lesions or ulcercations.  Neurological: Denies dizziness, difficulty with memory, difficulty with speech or problems with balance and coordination.  Psych: Denies anxiety, depression, SI/HI.  No other specific complaints in a complete review of systems (except as listed in HPI above).  PE:  BP (!) 137/99 (BP Location: Right Arm, Patient Position: Sitting, Cuff Size: Normal)   Pulse 75   Temp (!) 97.5 F (36.4 C) (Temporal)  Resp 17   Ht 5' (1.524 m)   Wt 183 lb 9.6 oz (83.3 kg)   SpO2 100%   BMI 35.86 kg/m  Wt Readings from Last 3 Encounters:  03/13/21 183 lb 9.6 oz (83.3 kg)  02/27/19 180 lb (81.6 kg)  08/18/18 180 lb (81.6 kg)    General: Appears her stated age, obese, in NAD. HEENT: Head: normal shape and size; Eyes: sclera white and EOMs intact;  Neck: Neck supple, trachea midline. No masses, lumps or thyromegaly present.  Cardiovascular: Normal rate and rhythm. S1,S2 noted.  No murmur, rubs or gallops noted. No  JVD or BLE edema. No carotid bruits noted. Pulmonary/Chest: Normal effort and positive vesicular breath sounds. No respiratory distress. No wheezes, rales or ronchi noted.  Musculoskeletal: Normal flexion, extension and rotation of the cervical spine. No bony tenderness noted over the lumbar spine.  Neurological: Alert and oriented. Coordination normal.  Psychiatric: Mood and affect normal. Behavior is normal. Judgment and thought content normal.    BMET    Component Value Date/Time   NA 137 10/07/2016 0007   K 4.0 10/07/2016 0007   CL 107 10/07/2016 0007   CO2 23 10/07/2016 0007   GLUCOSE 58 (L) 10/07/2016 0007   BUN 17 10/07/2016 0007   CREATININE 0.71 10/07/2016 0007   CALCIUM 8.9 10/07/2016 0007   GFRNONAA >60 10/07/2016 0007   GFRAA >60 10/07/2016 0007    Lipid Panel  No results found for: CHOL, TRIG, HDL, CHOLHDL, VLDL, LDLCALC  CBC    Component Value Date/Time   WBC 9.1 10/08/2016 0543   RBC 3.70 (L) 10/08/2016 0543   HGB 10.7 (L) 10/08/2016 0543   HCT 31.1 (L) 10/08/2016 0543   PLT 145 (L) 10/09/2016 0541   MCV 83.8 10/08/2016 0543   MCH 29.0 10/08/2016 0543   MCHC 34.6 10/08/2016 0543   RDW 15.0 (H) 10/08/2016 0543    Hgb A1C No results found for: HGBA1C   Assessment and Plan:  Left Ear Pain:  Exam benign Continue Claritin OTC Add Flonase 1 spray left nostril daily  Elevated Blood Pressure in Office without Diagnosis of HTN:  I wonder if this is contributing to her headaches Will monitor, if remains elevated, will need to discuss antihypertensive therapy Encouraged DASH diet and exercise for weight loss  RTC in 1 month for follow up of migraines Nicki Reaper, NP This visit occurred during the SARS-CoV-2 public health emergency.  Safety protocols were in place, including screening questions prior to the visit, additional usage of staff PPE, and extensive cleaning of exam room while observing appropriate contact time as indicated for disinfecting  solutions.

## 2021-03-13 NOTE — Patient Instructions (Signed)
Cefalea migraosa Migraine Headache Una cefalea migraosa es un dolor muy intenso y punzante en uno o ambos lados de la cabeza. Este tipo de dolor de cabeza tambin puede causar otros sntomas. Puede durar desde 4 horas hasta 3 das. Hable con su mdico sobre las cosas que pueden causar (desencadenar) esta afeccin. Cules son las causas? Se desconoce la causa exacta de esta afeccin. Esta afeccin puede desencadenarse o ser causada por lo siguiente: Consumo de alcohol. Consumo de cigarrillos. Tomar medicamentos como por ejemplo: Medicamentos para aliviar el dolor torcico (nitroglicerina). Anticonceptivos orales. Estrgeno. Algunos medicamentos para la presin arterial. Comer o beber ciertos productos. Hacer actividad fsica. Otros factores que pueden provocar cefalea migraosa son los siguientes: Tener el perodo menstrual. Embarazo. Hambre. Estrs. No dormir lo suficiente o dormir demasiado. Cambios climticos. Cansancio (fatiga). Qu incrementa el riesgo? Tener entre 25 y 55 aos de edad. Ser mujer. Tener antecedentes familiares de cefalea migraosa. Ser de raza caucsica. Tener depresin o ansiedad. Tener mucho sobrepeso. Cules son los signos o los sntomas? Un dolor punzante. Este dolor puede tener las siguientes caractersticas: Puede aparecer en cualquier regin de la cabeza, tanto de un lado como de ambos. Puede dificultar las actividades cotidianas. Puede empeorar con la actividad fsica. Puede empeorar con las luces brillantes o los ruidos fuertes. Otros sntomas pueden incluir: Ganas de vomitar (nuseas). Vmitos. Mareos. Sensibilidad a las luces brillantes, los ruidos fuertes o los olores. Antes de tener una cefalea migraosa, puede recibir seales de advertencia (aura). Un aura puede incluir: Ver luces intermitentes o tener puntos ciegos. Ver puntos brillantes, halos o lneas en zigzag. Tener una visin en tnel o visin borrosa. Sentir entumecimiento u  hormigueo. Tener dificultad para hablar. Tener msculos dbiles. Algunas personas tienen sntomas despus de una cefalea migraosa (fase posdromal), como los siguientes: Cansancio. Dificultad para pensar (concentrarse). Cmo se trata? Tomar medicamentos para: Aliviar el dolor. Aliviar la sensacin de malestar estomacal. Prevenir las cefaleas migraosas. El tratamiento tambin puede incluir lo siguiente: Tomar sesiones de acupuntura. Evitar los alimentos que provocan las cefaleas migraosas. Aprender maneras de controlar las funciones corporales (biorretroalimentacin). Terapia para ayudarlo a conocer y lidiar con los pensamientos negativos (terapia cognitivo conductual). Siga estas instrucciones en su casa: Medicamentos Tome los medicamentos de venta libre y los recetados solamente como se lo haya indicado el mdico. Consulte a su mdico si el medicamento que le recetaron: Hace que sea necesario que evite conducir o usar maquinaria pesada. Puede causarle dificultad para defecar (estreimiento). Es posible que deba tomar estas medidas para prevenir o tratar los problemas para defecar: Beber suficiente lquido para mantener el pis (la orina) de color amarillo plido. Tomar medicamentos recetados o de venta libre. [place]er alimentos ricos en fibra. Entre ellos, frijoles, cereales integrales y frutas y verduras frescas. Limitar los alimentos con alto contenido de grasa y azcar. Estos incluyen alimentos fritos o dulces. Estilo de vida No beba alcohol. No consuma ningn producto que contenga nicotina o tabaco, como cigarrillos, cigarrillos electrnicos y tabaco de mascar. Si necesita ayuda para dejar de fumar, consulte al mdico. Duerma como mnimo 8 horas todas las noches. Limite el estrs y manjelo. Indicaciones generales   Lleve un registro diario para averiguar lo que puede provocar las cefaleas migraosas. Registre, por ejemplo, lo siguiente: Lo que usted come y bebe. El tiempo que  duerme. Algn cambio en lo que come o bebe. Algn cambio en sus medicamentos. Si tiene una cefalea migraosa: Evite los factores que empeoren los sntomas, como las luces brillantes. Resulta   til acostarse en una habitacin oscura y silenciosa. No conduzca vehculos ni opere maquinaria pesada. Pregntele al mdico qu actividades son seguras para usted. Concurra a todas las visitas de seguimiento como se lo haya indicado el mdico. Esto es importante. Comunquese con un mdico si: Tiene una cefalea migraosa que es diferente o peor que otras que ha tenido. Tiene ms de 15 das de cefalea por mes. Solicite ayuda inmediatamente si: La cefalea migraosa empeora mucho. La cefalea migraosa dura ms de 72 horas. Tiene fiebre. Presenta rigidez en el cuello. Tiene dificultad para ver. Siente debilidad en los msculos o que no puede controlarlos. Comienza a perder el equilibrio continuamente. Comienza a tener dificultad para caminar. Pierde el conocimiento (se desmaya). Tiene una convulsin. Resumen Una cefalea migraosa es un dolor muy intenso y punzante en uno o ambos lados de la cabeza. Estos dolores de cabeza tambin pueden causar otros sntomas. Esta afeccin puede tratarse con medicamentos y cambios en el estilo de vida. Lleve un registro diario para averiguar lo que puede provocar las cefaleas migraosas. Comunquese con un mdico si tiene una cefalea migraosa que es diferente o peor que otras que ha tenido. Comunquese con el mdico si tiene ms de 15 das de cefalea en un mes. Esta informacin no tiene como fin reemplazar el consejo del mdico. Asegrese de hacerle al mdico cualquier pregunta que tenga. Document Revised: 11/13/2018 Document Reviewed: 11/13/2018 Elsevier Patient Education  2022 [employer]  

## 2021-03-13 NOTE — Assessment & Plan Note (Signed)
Will obtain CT head w/wo contrast Will trial Topamax 25 mg QHS- sedation caution given

## 2021-04-12 ENCOUNTER — Ambulatory Visit
Admission: RE | Admit: 2021-04-12 | Discharge: 2021-04-12 | Disposition: A | Discharge status: Home / Self Care | Payer: 59 | Source: Ambulatory Visit | Attending: Internal Medicine | Admitting: Internal Medicine

## 2021-04-12 ENCOUNTER — Encounter: Payer: Self-pay | Admitting: Internal Medicine

## 2021-04-12 ENCOUNTER — Other Ambulatory Visit: Payer: Self-pay

## 2021-04-12 ENCOUNTER — Ambulatory Visit (INDEPENDENT_AMBULATORY_CARE_PROVIDER_SITE_OTHER): Payer: 59 | Admitting: Internal Medicine

## 2021-04-12 ENCOUNTER — Ambulatory Visit
Admission: RE | Admit: 2021-04-12 | Discharge: 2021-04-12 | Disposition: A | Discharge status: Home / Self Care | Payer: 59 | Source: Home / Self Care | Attending: Internal Medicine | Admitting: Internal Medicine

## 2021-04-12 VITALS — BP 109/82 | HR 69 | Temp 97.1°F | Resp 17 | Ht 60.0 in | Wt 180.4 lb

## 2021-04-12 DIAGNOSIS — M542 Cervicalgia: Secondary | ICD-10-CM | POA: Insufficient documentation

## 2021-04-12 DIAGNOSIS — G43C1 Periodic headache syndromes in child or adult, intractable: Secondary | ICD-10-CM

## 2021-04-12 NOTE — Assessment & Plan Note (Addendum)
Improved with Topamax CT head scheduled for 8/2 Consider referral to neurology for further evaluation of symptoms pending CT Referral to ophthalmology placed for vision exam

## 2021-04-12 NOTE — Patient Instructions (Signed)
General Headache Without Cause A headache is pain or discomfort that is felt around the head or neck area. There are many causes and types of headaches. In some cases, the cause may not be found. Follow these instructions at home: Watch your condition for any changes. Let your doctor know about them. Take these steps to help with your condition: Managing pain   Take over-the-counter and prescription medicines only as told by your doctor. Lie down in a dark, quiet room when you have a headache. If told, put ice on your head and neck area: Put ice in a plastic bag. Place a towel between your skin and the bag. Leave the ice on for 20 minutes, 2-3 times per day. If told, put heat on the affected area. Use the heat source that your doctor recommends, such as a moist heat pack or a heating pad. Place a towel between your skin and the heat source. Leave the heat on for 20-30 minutes. Remove the heat if your skin turns bright red. This is very important if you are unable to feel pain, heat, or cold. You may have a greater risk of getting burned. Keep lights dim if bright lights bother you or make your headaches worse. Eating and drinking Eat meals on a regular schedule. If you drink alcohol: Limit how much you use to: 0-1 drink a day for women. 0-2 drinks a day for men. Be aware of how much alcohol is in your drink. In the [place] one drink equals one 12 oz bottle of beer (355 mL), one 5 oz glass of wine (148 mL), or one 1 oz glass of hard liquor (44 mL). Stop drinking caffeine, or reduce how much caffeine you drink. General instructions  Keep a journal to find out if certain things bring on headaches. For example, write down: What you eat and drink. How much sleep you get. Any change to your diet or medicines. Get a massage or try other ways to relax. Limit stress. Sit up straight. Do not tighten (tense) your muscles. Do not use any products that contain nicotine or tobacco. This includes  cigarettes, e-cigarettes, and chewing tobacco. If you need help quitting, ask your doctor. Exercise regularly as told by your doctor. Get enough sleep. This often means 7-9 hours of sleep each night. Keep all follow-up visits as told by your doctor. This is important. Contact a doctor if: Your symptoms are not helped by medicine. You have a headache that feels different than the other headaches. You feel sick to your stomach (nauseous) or you throw up (vomit). You have a fever. Get help right away if: Your headache gets very bad quickly. Your headache gets worse after a lot of physical activity. You keep throwing up. You have a stiff neck. You have trouble seeing. You have trouble speaking. You have pain in the eye or ear. Your muscles are weak or you lose muscle control. You lose your balance or have trouble walking. You feel like you will pass out (faint) or you pass out. You are mixed up (confused). You have a seizure. Summary A headache is pain or discomfort that is felt around the head or neck area. There are many causes and types of headaches. In some cases, the cause may not be found. Keep a journal to help find out what causes your headaches. Watch your condition for any changes. Let your doctor know about them. Contact a doctor if you have a headache that is different from usual, or   if your headache is not helped by medicine. Get help right away if your headache gets very bad, you throw up, you have trouble seeing, you lose your balance, or you have a seizure. This information is not intended to replace advice given to you by your health care provider. Make sure you discuss any questions you have with your health care provider. Document Revised: 03/23/2018 Document Reviewed: 03/23/2018 Elsevier Patient Education  2022 [employer] Inc[employer]  

## 2021-04-12 NOTE — Progress Notes (Signed)
Subjective:    Patient ID: Nicole Mayo, female    DOB: December 27, 1980, 40 y.o.   MRN: [id number]  HPI  Pt presents to the clinic today for follow up of migraines. At her last visit, her BP was slightly elevated.  It was felt like this might be a contributing factor to her headaches, however her BP today is 109/82.  She was started on Topamax which she reports has improved the frequency of her headaches.  She still reports some heaviness in her head and intermittent neck pain.  She reports she has not heard anything about the CT of her head, or when that will be scheduled.  She wonders if she should make an appointment for an eye exam given the fact that she is having these frequent headaches.  Review of Systems     Past Medical History:  Diagnosis Date   Sciatica     Current Outpatient Medications  Medication Sig Dispense Refill   ibuprofen (ADVIL,MOTRIN) 600 MG tablet Take 1 tablet (600 mg total) by mouth every 6 (six) hours. 30 tablet 0   topiramate (TOPAMAX) 25 MG tablet Take 1 tablet (25 mg total) by mouth at bedtime. 30 tablet 1   pantoprazole (PROTONIX) 40 MG tablet Take 40 mg by mouth 2 (two) times daily. (Patient not taking: Reported on 04/12/2021)     No current facility-administered medications for this visit.    No Known Allergies  Family History  Problem Relation Age of Onset   Hypertension Mother    Hypertension Father     Social History   Socioeconomic History   Marital status: Single    Spouse name: Not on file   Number of children: Not on file   Years of education: Not on file   Highest education level: Not on file  Occupational History   Not on file  Tobacco Use   Smoking status: Never   Smokeless tobacco: Never  Vaping Use   Vaping Use: Never used  Substance and Sexual Activity   Alcohol use: Yes    Comment: socially, not currently    Drug use: No   Sexual activity: Not Currently  Other Topics Concern   Not on file  Social History Narrative   Not  on file   Social Determinants of Health   Financial Resource Strain: Not on file  Food Insecurity: Not on file  Transportation Needs: Not on file  Physical Activity: Not on file  Stress: Not on file  Social Connections: Not on file  Intimate Partner Violence: Not on file     Constitutional: Patient reports frequent headaches.  Denies fever, malaise, fatigue, or abrupt weight changes.  HEENT: Denies eye pain, eye redness, ear pain, ringing in the ears, wax buildup, runny nose, nasal congestion, bloody nose, or sore throat. Respiratory: Denies difficulty breathing, shortness of breath, cough or sputum production.   Cardiovascular: Denies chest pain, chest tightness, palpitations or swelling in the hands or feet.  Musculoskeletal: Patient reports intermittent neck pain.  Denies decrease in range of motion, difficulty with gait, muscle pain or joint swelling.  Skin: Denies redness, rashes, lesions or ulcercations.  Neurological: Denies dizziness, difficulty with memory, difficulty with speech or problems with balance and coordination.   No other specific complaints in a complete review of systems (except as listed in HPI above).  Objective:   Physical Exam  BP 109/82 (BP Location: Right Arm, Patient Position: Sitting, Cuff Size: Normal)   Pulse 69   Temp (!) 97.1  F (36.2 C) (Temporal)   Resp 17   Ht 5' (1.524 m)   Wt 180 lb 6.4 oz (81.8 kg)   SpO2 100%   BMI 35.23 kg/m  Wt Readings from Last 3 Encounters:  04/12/21 180 lb 6.4 oz (81.8 kg)  03/13/21 183 lb 9.6 oz (83.3 kg)  02/27/19 180 lb (81.6 kg)    General: Appears her stated age, obese, in NAD. HEENT: Head: normal shape and size; Eyes: sclera white and EOMs intact;  Cardiovascular: Normal rate. Pulmonary/Chest: Normal effort and positive vesicular breath sounds.  Musculoskeletal: Normal flexion, extension, rotation and lateral bending of the cervical spine. Neurological: Alert and oriented.   BMET    Component  Value Date/Time   NA 137 10/07/2016 0007   K 4.0 10/07/2016 0007   CL 107 10/07/2016 0007   CO2 23 10/07/2016 0007   GLUCOSE 58 (L) 10/07/2016 0007   BUN 17 10/07/2016 0007   CREATININE 0.71 10/07/2016 0007   CALCIUM 8.9 10/07/2016 0007   GFRNONAA >60 10/07/2016 0007   GFRAA >60 10/07/2016 0007    Lipid Panel  No results found for: CHOL, TRIG, HDL, CHOLHDL, VLDL, LDLCALC  CBC    Component Value Date/Time   WBC 9.1 10/08/2016 0543   RBC 3.70 (L) 10/08/2016 0543   HGB 10.7 (L) 10/08/2016 0543   HCT 31.1 (L) 10/08/2016 0543   PLT 145 (L) 10/09/2016 0541   MCV 83.8 10/08/2016 0543   MCH 29.0 10/08/2016 0543   MCHC 34.6 10/08/2016 0543   RDW 15.0 (H) 10/08/2016 0543    Hgb A1C No results found for: HGBA1C          Assessment & Plan:   Neck Pain:  Intermittent X-ray cervical spine today  We will follow-up after imaging, return precautions discussed Nicki Reaper, NP This visit occurred during the SARS-CoV-2 public health emergency.  Safety protocols were in place, including screening questions prior to the visit, additional usage of staff PPE, and extensive cleaning of exam room while observing appropriate contact time as indicated for disinfecting solutions.

## 2021-04-17 ENCOUNTER — Ambulatory Visit
Admission: RE | Admit: 2021-04-17 | Discharge: 2021-04-17 | Disposition: A | Discharge status: Home / Self Care | Payer: 59 | Source: Ambulatory Visit | Attending: Internal Medicine | Admitting: Internal Medicine

## 2021-04-17 ENCOUNTER — Other Ambulatory Visit: Payer: Self-pay

## 2021-04-17 DIAGNOSIS — G43C1 Periodic headache syndromes in child or adult, intractable: Secondary | ICD-10-CM | POA: Insufficient documentation

## 2021-04-17 MED ORDER — IOHEXOL 350 MG/ML SOLN
50.0000 mL | Freq: Once | INTRAVENOUS | Status: AC | PRN
  Administered 2021-04-17: 50 mL via INTRAVENOUS

## 2021-05-15 ENCOUNTER — Other Ambulatory Visit: Payer: Self-pay | Admitting: Internal Medicine

## 2021-05-15 DIAGNOSIS — G43C1 Periodic headache syndromes in child or adult, intractable: Secondary | ICD-10-CM

## 2021-05-15 NOTE — Telephone Encounter (Signed)
Requested medications are due for refill today.  yes  Requested medications are on the active medications list.  yes  Last refill. 03/13/2021  Future visit scheduled.   no  Notes to clinic.  Medication not delegated. 

## 2022-02-19 ENCOUNTER — Encounter: Payer: Self-pay | Admitting: Internal Medicine

## 2022-02-19 ENCOUNTER — Ambulatory Visit (INDEPENDENT_AMBULATORY_CARE_PROVIDER_SITE_OTHER): Payer: 59 | Admitting: Internal Medicine

## 2022-02-19 VITALS — BP 126/84 | HR 83 | Temp 96.8°F | Ht 60.0 in | Wt 184.0 lb

## 2022-02-19 DIAGNOSIS — G43C1 Periodic headache syndromes in child or adult, intractable: Secondary | ICD-10-CM

## 2022-02-19 DIAGNOSIS — H9202 Otalgia, left ear: Secondary | ICD-10-CM

## 2022-02-19 DIAGNOSIS — Z23 Encounter for immunization: Secondary | ICD-10-CM

## 2022-02-19 DIAGNOSIS — Z0001 Encounter for general adult medical examination with abnormal findings: Secondary | ICD-10-CM | POA: Diagnosis not present

## 2022-02-19 DIAGNOSIS — R0981 Nasal congestion: Secondary | ICD-10-CM

## 2022-02-19 DIAGNOSIS — Z6835 Body mass index (BMI) 35.0-35.9, adult: Secondary | ICD-10-CM

## 2022-02-19 DIAGNOSIS — B354 Tinea corporis: Secondary | ICD-10-CM

## 2022-02-19 MED ORDER — FLUTICASONE PROPIONATE 50 MCG/ACT NA SUSP: 2.0000 | Freq: Every day | NASAL | 6 refills | Status: AC

## 2022-02-19 MED ORDER — CLOTRIMAZOLE-BETAMETHASONE 1-0.05 % EX CREA: TOPICAL_CREAM | Freq: Two times a day (BID) | CUTANEOUS | 0 refills | Status: DC

## 2022-02-19 MED ORDER — NURTEC 75 MG PO TBDP: 75.0000 mg | ORAL_TABLET | ORAL | 2 refills | Status: DC

## 2022-02-19 NOTE — Assessment & Plan Note (Signed)
Persistent We will trial Nurtec Referral placed to neurology for further evaluation and treatment

## 2022-02-19 NOTE — Progress Notes (Signed)
Subjective:    Patient ID: Nicole Mayo, female    DOB: 07-20-1981, 41 y.o.   MRN: [id number]  HPI  Patient presents to clinic today for her annual exam.  Headaches: These occur a few times per week but last for days at a time.  She is not sure what triggers this, maybe stress. She is no longer taking Topamax as prescribed  but is taking Ibuprofen with some results.  She does not follow with neurology.  Flu: never Tetanus: unsure COVID: Moderna x2, Pfizer x1 Pap smear: 2023, [place] screening: as needed Dentist: as needed  Diet: She does eat meat. She consumes fruits and veggies. She tries to avoid fried foods. She drinks mostly water. Exercise: None  Review of Systems     Past Medical History:  Diagnosis Date   Sciatica     Current Outpatient Medications  Medication Sig Dispense Refill   ibuprofen (ADVIL,MOTRIN) 600 MG tablet Take 1 tablet (600 mg total) by mouth every 6 (six) hours. 30 tablet 0   topiramate (TOPAMAX) 25 MG tablet TAKE 1 TABLET(25 MG) BY MOUTH AT BEDTIME 90 tablet 0   No current facility-administered medications for this visit.    No Known Allergies  Family History  Problem Relation Age of Onset   Hypertension Mother    Hypertension Father     Social History   Socioeconomic History   Marital status: Single    Spouse name: Not on file   Number of children: Not on file   Years of education: Not on file   Highest education level: Not on file  Occupational History   Not on file  Tobacco Use   Smoking status: Never   Smokeless tobacco: Never  Vaping Use   Vaping Use: Never used  Substance and Sexual Activity   Alcohol use: Yes    Comment: socially, not currently    Drug use: No   Sexual activity: Not Currently  Other Topics Concern   Not on file  Social History Narrative   Not on file   Social Determinants of Health   Financial Resource Strain: Not on file  Food Insecurity: Not on file  Transportation Needs: Not on  file  Physical Activity: Not on file  Stress: Not on file  Social Connections: Not on file  Intimate Partner Violence: Not on file     Constitutional: Patient reports intermittent headaches.  Denies fever, malaise, fatigue, or abrupt weight changes.  HEENT: Pt reports left ear pain, nasal congestion. Denies eye pain, eye redness, ringing in the ears, wax buildup, runny nose, bloody nose, or sore throat. Respiratory: Denies difficulty breathing, shortness of breath, cough or sputum production.   Cardiovascular: Denies chest pain, chest tightness, palpitations or swelling in the hands or feet.  Gastrointestinal: Denies abdominal pain, bloating, constipation, diarrhea or blood in the stool.  GU: Denies urgency, frequency, pain with urination, burning sensation, blood in urine, odor or discharge. Musculoskeletal: Denies decrease in range of motion, difficulty with gait, muscle pain or joint pain and swelling.  Skin: Pt reports skin lesion of midline back. Denies redness, rashes, or ulcercations.  Neurological: Denies dizziness, difficulty with memory, difficulty with speech or problems with balance and coordination.  Psych: Denies anxiety, depression, SI/HI.  No other specific complaints in a complete review of systems (except as listed in HPI above).  Objective:   Physical Exam  BP 126/84 (BP Location: Left Arm, Patient Position: Sitting, Cuff Size: Normal)   Pulse 83  Temp (!) 96.8 F (36 C) (Temporal)   Ht 5' (1.524 m)   Wt 184 lb (83.5 kg)   SpO2 100%   BMI 35.94 kg/m   Wt Readings from Last 3 Encounters:  04/12/21 180 lb 6.4 oz (81.8 kg)  03/13/21 183 lb 9.6 oz (83.3 kg)  02/27/19 180 lb (81.6 kg)    General: Appears her stated age, obese, in NAD. Skin: Warm, dry and intact. Fungal rash noted of midline back. HEENT: Head: normal shape and size; Eyes: sclera white, no icterus, conjunctiva pink, PERRLA and EOMs intact; Ears: Tm's gray and intact, normal light reflex;  Neck:   Neck supple, trachea midline. No masses, lumps or thyromegaly present.  Cardiovascular: Normal rate and rhythm. S1,S2 noted.  No murmur, rubs or gallops noted. No JVD or BLE edema.  Pulmonary/Chest: Normal effort and positive vesicular breath sounds. No respiratory distress. No wheezes, rales or ronchi noted.  Abdomen: Soft and nontender. Normal bowel sounds. No distention or masses noted.  Musculoskeletal: Strength 5/5 BUE/BLE. No difficulty with gait.  Neurological: Alert and oriented. Cranial nerves II-XII grossly intact. Coordination normal.  Psychiatric: Mood and affect normal. Behavior is normal. Judgment and thought content normal.    BMET    Component Value Date/Time   NA 137 10/07/2016 0007   K 4.0 10/07/2016 0007   CL 107 10/07/2016 0007   CO2 23 10/07/2016 0007   GLUCOSE 58 (L) 10/07/2016 0007   BUN 17 10/07/2016 0007   CREATININE 0.71 10/07/2016 0007   CALCIUM 8.9 10/07/2016 0007   GFRNONAA >60 10/07/2016 0007   GFRAA >60 10/07/2016 0007    Lipid Panel  No results found for: CHOL, TRIG, HDL, CHOLHDL, VLDL, LDLCALC  CBC    Component Value Date/Time   WBC 9.1 10/08/2016 0543   RBC 3.70 (L) 10/08/2016 0543   HGB 10.7 (L) 10/08/2016 0543   HCT 31.1 (L) 10/08/2016 0543   PLT 145 (L) 10/09/2016 0541   MCV 83.8 10/08/2016 0543   MCH 29.0 10/08/2016 0543   MCHC 34.6 10/08/2016 0543   RDW 15.0 (H) 10/08/2016 0543    Hgb A1C No results found for: HGBA1C          Assessment & Plan:   Preventative Health Maintenance:  Encouraged her to get a flu shot in the fall Tetanus today Encouraged her to get a COVID booster Pap smear UTD, will request copy Encouraged her to consume a balanced diet and exercise regimen Advised her to see an eye doctor and dentist annually We will check CBC, c-Met, lipid, A1c today  Tine Corporis:  RX for Clotrimazole-Betamethasone cream BID x 4 weeks  Nasal Congestion, Left Ear Pain:  We will start Flonase daily  RTC in 1  year, sooner if needed Webb Silversmith, NP

## 2022-02-19 NOTE — Assessment & Plan Note (Signed)
Encourage diet and exercise for weight loss 

## 2022-02-19 NOTE — Patient Instructions (Signed)

## 2022-04-25 ENCOUNTER — Encounter: Payer: Self-pay | Admitting: Internal Medicine

## 2023-05-30 ENCOUNTER — Other Ambulatory Visit: Payer: Self-pay

## 2023-05-30 DIAGNOSIS — N644 Mastodynia: Secondary | ICD-10-CM

## 2023-06-09 ENCOUNTER — Encounter: Payer: Self-pay | Admitting: Primary Care

## 2023-06-16 ENCOUNTER — Ambulatory Visit
Admission: RE | Admit: 2023-06-16 | Discharge: 2023-06-16 | Disposition: A | Discharge status: Home / Self Care | Payer: Self-pay | Source: Ambulatory Visit | Attending: Obstetrics and Gynecology | Admitting: Obstetrics and Gynecology

## 2023-06-16 ENCOUNTER — Ambulatory Visit: Payer: Self-pay | Attending: Hematology and Oncology | Admitting: Hematology and Oncology

## 2023-06-16 VITALS — BP 141/101 | Wt 192.0 lb

## 2023-06-16 DIAGNOSIS — N644 Mastodynia: Secondary | ICD-10-CM

## 2023-06-16 NOTE — Patient Instructions (Addendum)
Taught Nicole Mayo about self breast awareness and gave educational materials to take home. Patient did not need a Pap smear today due to last Pap smear was in 09/14/20 per patient. Let her know [facility] will cover Pap smears every 5 years unless has a history of abnormal Pap smears. Referred patient to the [facility] for diagnostic mammogram. Appointment scheduled for 06/16/23. Patient aware of appointment and will be there. Let patient know will follow up with her within the next couple weeks with results. [name] verbalized understanding.  Pascal Lux, NP 12:07 PM

## 2023-06-16 NOTE — Progress Notes (Signed)
[name] is a 42 y.o. female who presents to [facility] clinic today with complaint of right breast pain.    Pap Smear: Pap not smear completed today. Last Pap smear was 09/14/2020 at [facility] clinic and was normal. Per patient has no history of an abnormal Pap smear. Last Pap smear result is available in Epic.   Physical exam: Breasts Breasts symmetrical. No skin abnormalities bilateral breasts. No nipple retraction bilateral breasts. No nipple discharge bilateral breasts. No lymphadenopathy. No lumps palpated bilateral breasts.        Pelvic/Bimanual Pap is not indicated today    Smoking History: Patient has never smoked and was not referred to quit line.    Patient Navigation: Patient education provided. Access to services provided for patient through [facility] program. Aram Beecham [phone]) interpreter provided. No transportation provided   Colorectal Cancer Screening: Per patient has never had colonoscopy completed No complaints today.    Breast and Cervical Cancer Risk Assessment: Patient does not have family history of breast cancer, known genetic mutations, or radiation treatment to the chest before age 67. Patient does not have history of cervical dysplasia, immunocompromised, or DES exposure in-utero.    A: [facility] exam without pap smear Complaint of right breast pain that comes and goes. Benign exam.   P: Referred patient to the [facility] for a diagnostic mammogram. Appointment scheduled 06/16/23.  Pascal Lux, NP 06/16/2023 12:07 PM

## 2023-07-18 IMAGING — CT CT HEAD WO/W CM
4 of 5 series · 15 of 47 positions shown, 17 images · IV contrast (omnipaque)
Comparison: None.

CLINICAL DATA: Nystagmus.  Headache.

EXAM:
CT HEAD WITHOUT AND WITH CONTRAST
TECHNIQUE: Contiguous axial images were obtained from the base of the skull
through the vertex without and with intravenous contrast
CONTRAST:  50mL OMNIPAQUE IOHEXOL 350 MG/ML SOLN

[Series 2: axial st brain 5.00 ax · axial · 0.34mm/px · z∈[-529,-454]mm · 4 of 27 slices shown]
[im 6/27  brain]
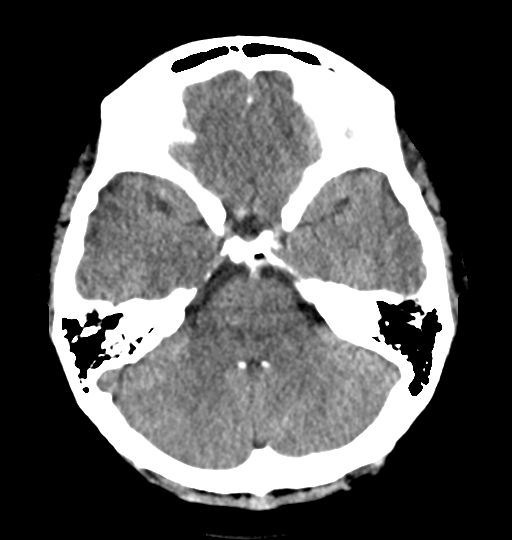
[im 11/27  brain]
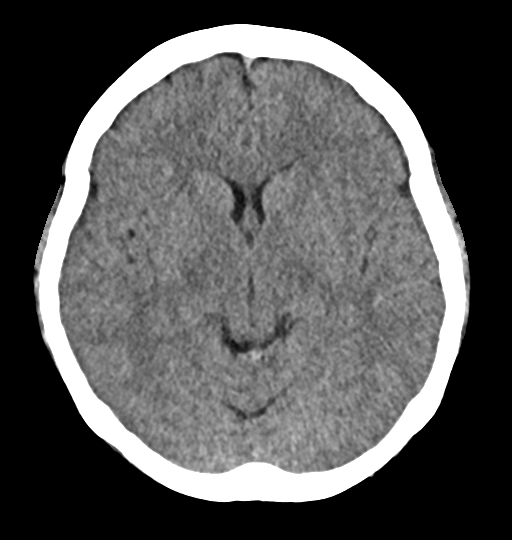
[im 16/27  brain]
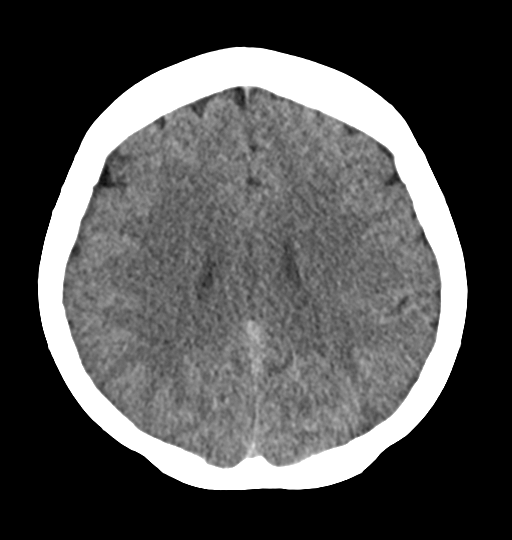
[im 21/27  brain]
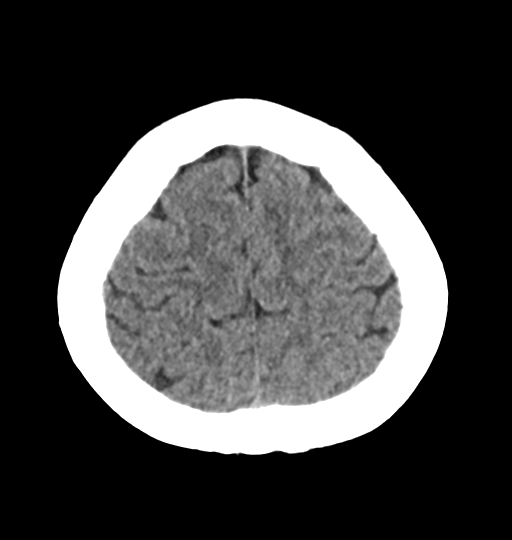

[Series 6: axial with brain 5.00 ax · axial · 0.34mm/px · z∈[-534,-449]mm · 5 of 27 slices shown, 7 images]
[im 5/27  brain]
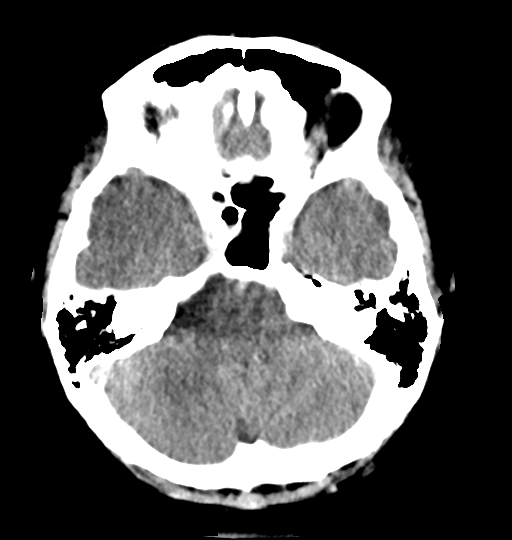
[im 5/27  bone]
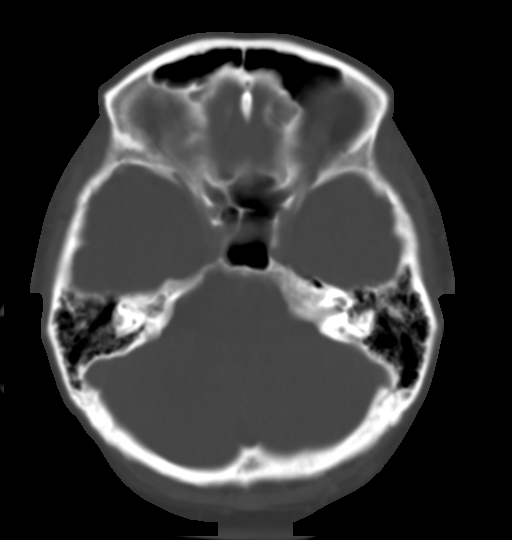
[im 9/27  brain]
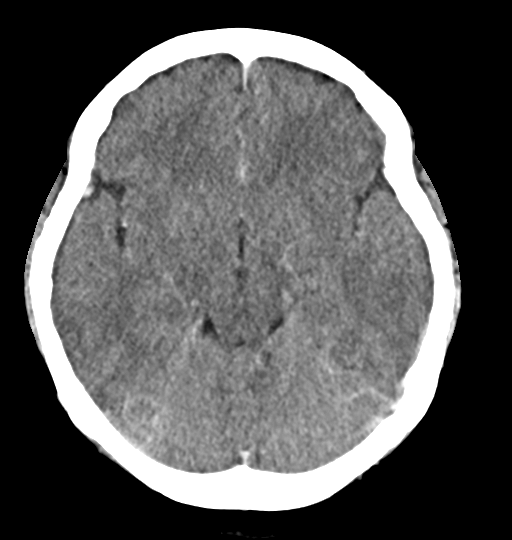
[im 14/27  brain]
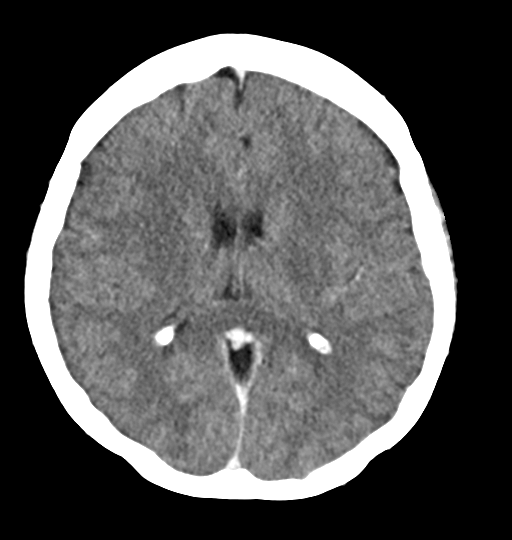
[im 18/27  brain]
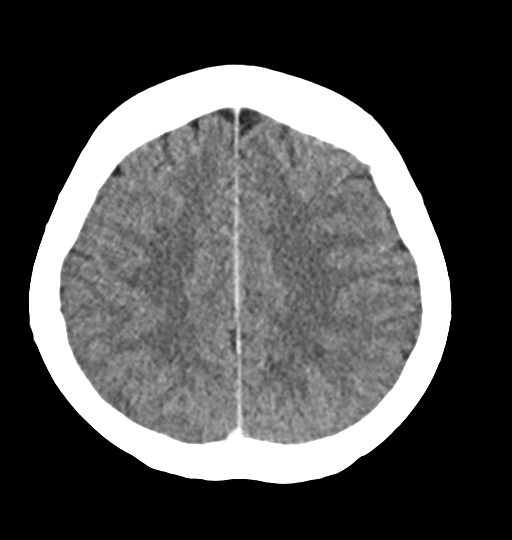
[im 22/27  brain]
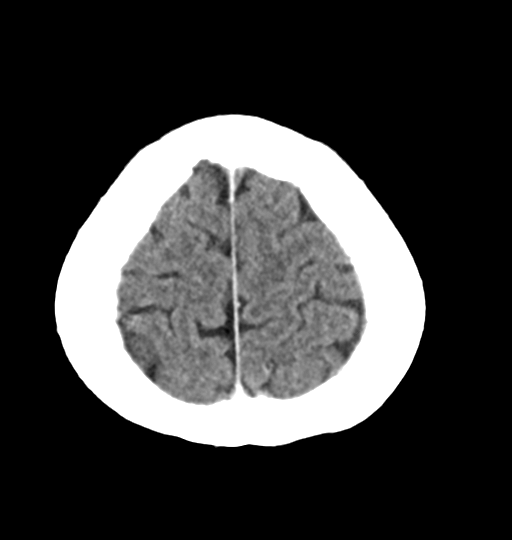
[im 22/27  bone]
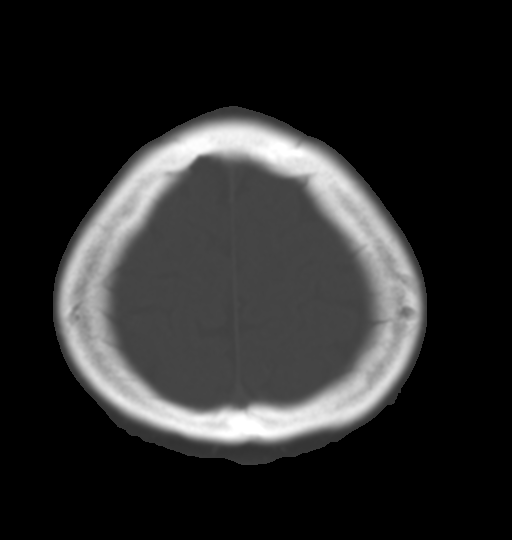

[Series 8: coronals brain 2.00 cor · coronal · 0.26mm/px · 3 of 91 slices shown]
[im 31/91  brain]
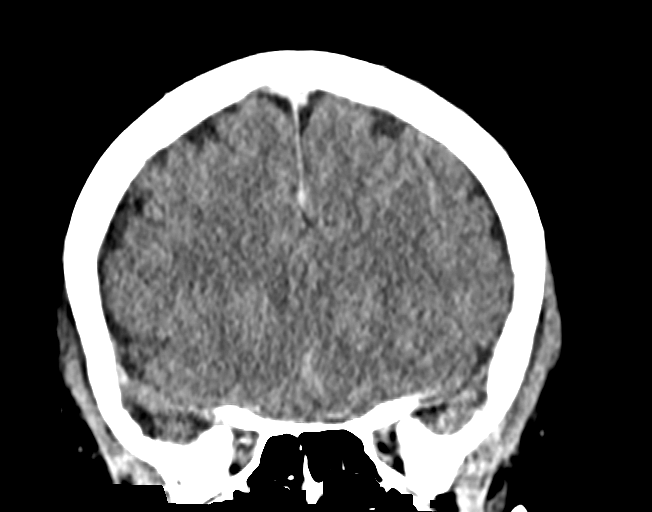
[im 41/91  brain]
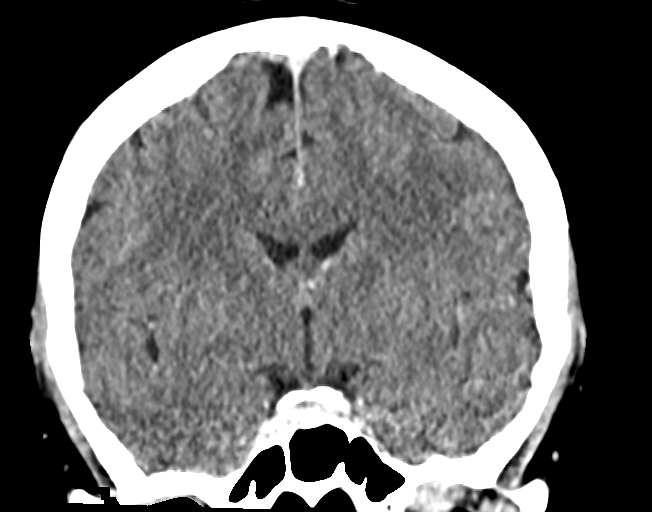
[im 51/91  brain]
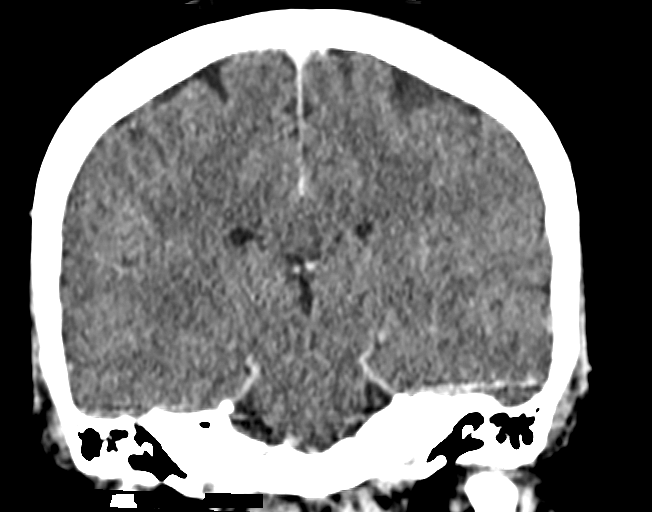

[Series 10: sagittals brain 2.00 sag · sagittal · 0.26mm/px · 3 of 86 slices shown]
[im 29/86  brain]
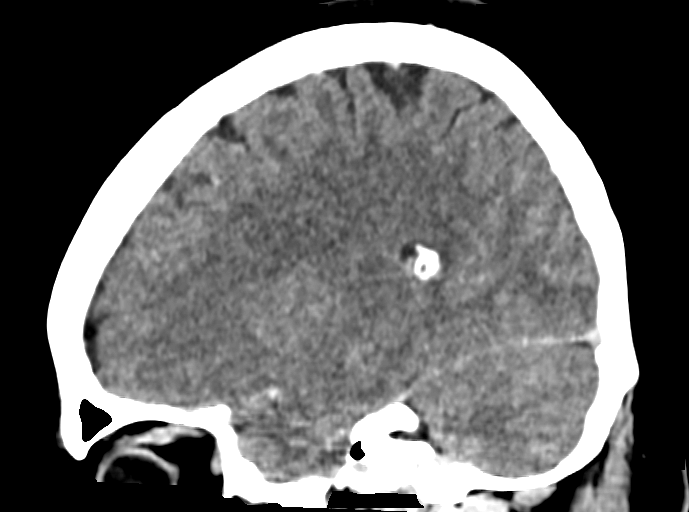
[im 43/86  brain]
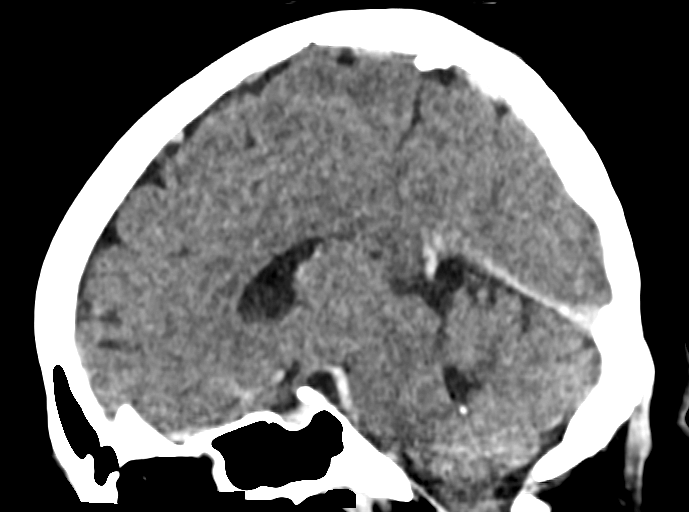
[im 57/86  brain]
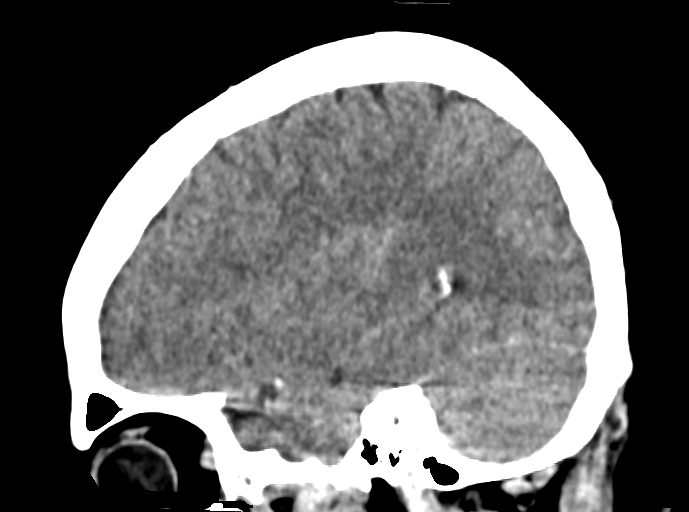

[15 of 47 positions shown; findings below may reference images not displayed]

FINDINGS: Brain: No evidence of infarction, hemorrhage, hydrocephalus,
extra-axial collection or mass lesion/mass effect. No abnormal
enhancement.

Vascular: Major vessels are enhancing

Skull: Normal. Negative for fracture or focal lesion.

Sinuses/Orbits: Negative
IMPRESSION: Negative head CT.

## 2024-07-02 ENCOUNTER — Encounter: Payer: Self-pay | Admitting: Family Medicine
